# Patient Record
Sex: Female | Born: 1985 | Race: White | Hispanic: No | Marital: Married | Smoking: Current some day smoker
Health system: Southern US, Community
[De-identification: ages and names within clinical notes are randomized; demographics above are authoritative.]

## PROBLEM LIST (undated history)

## (undated) DIAGNOSIS — R51 Headache: Secondary | ICD-10-CM

---
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
## 2006-04-10 ENCOUNTER — Ambulatory Visit (HOSPITAL_COMMUNITY): Admission: AD | Admit: 2006-04-10 | Discharge: 2006-04-10 | Payer: Self-pay | Admitting: Obstetrics and Gynecology

## 2006-04-11 ENCOUNTER — Ambulatory Visit (HOSPITAL_COMMUNITY): Admission: AD | Admit: 2006-04-11 | Discharge: 2006-04-12 | Payer: Self-pay | Admitting: Obstetrics and Gynecology

## 2006-04-24 ENCOUNTER — Ambulatory Visit (HOSPITAL_COMMUNITY): Admission: AD | Admit: 2006-04-24 | Discharge: 2006-04-25 | Payer: Self-pay | Admitting: Obstetrics and Gynecology

## 2006-04-27 ENCOUNTER — Ambulatory Visit (HOSPITAL_COMMUNITY): Admission: AD | Admit: 2006-04-27 | Discharge: 2006-04-27 | Payer: Self-pay | Admitting: Obstetrics and Gynecology

## 2006-05-03 ENCOUNTER — Encounter (INDEPENDENT_AMBULATORY_CARE_PROVIDER_SITE_OTHER): Payer: Self-pay | Admitting: Specialist

## 2006-05-03 ENCOUNTER — Inpatient Hospital Stay (HOSPITAL_COMMUNITY): Admission: RE | Admit: 2006-05-03 | Discharge: 2006-05-06 | Payer: Self-pay | Admitting: Obstetrics & Gynecology

## 2008-06-23 ENCOUNTER — Other Ambulatory Visit: Admission: RE | Admit: 2008-06-23 | Discharge: 2008-06-23 | Payer: Self-pay | Admitting: Obstetrics and Gynecology

## 2009-02-01 ENCOUNTER — Emergency Department (HOSPITAL_COMMUNITY): Admission: EM | Admit: 2009-02-01 | Discharge: 2009-02-01 | Payer: Self-pay | Admitting: Emergency Medicine

## 2009-09-20 ENCOUNTER — Other Ambulatory Visit: Admission: RE | Admit: 2009-09-20 | Discharge: 2009-09-20 | Payer: Self-pay | Admitting: Obstetrics and Gynecology

## 2010-01-04 ENCOUNTER — Emergency Department (HOSPITAL_COMMUNITY): Admission: EM | Admit: 2010-01-04 | Discharge: 2010-01-04 | Payer: Self-pay | Admitting: Emergency Medicine

## 2010-01-05 ENCOUNTER — Ambulatory Visit (HOSPITAL_COMMUNITY): Admission: RE | Admit: 2010-01-05 | Discharge: 2010-01-05 | Payer: Self-pay | Admitting: Emergency Medicine

## 2010-01-06 ENCOUNTER — Emergency Department (HOSPITAL_COMMUNITY): Admission: EM | Admit: 2010-01-06 | Discharge: 2010-01-06 | Payer: Self-pay | Admitting: Emergency Medicine

## 2010-02-06 ENCOUNTER — Encounter (INDEPENDENT_AMBULATORY_CARE_PROVIDER_SITE_OTHER): Payer: Self-pay | Admitting: *Deleted

## 2010-02-22 ENCOUNTER — Encounter (INDEPENDENT_AMBULATORY_CARE_PROVIDER_SITE_OTHER): Payer: Self-pay | Admitting: *Deleted

## 2010-07-28 ENCOUNTER — Emergency Department (HOSPITAL_COMMUNITY): Admission: EM | Admit: 2010-07-28 | Discharge: 2010-07-28 | Payer: Self-pay | Admitting: Emergency Medicine

## 2010-09-23 IMAGING — CT CT ABD-PELV W/ CM
2 of 4 series · 16 of 46 positions shown, 18 images · IV contrast (agent unspecified)
Comparison: None.

CLINICAL DATA: Vaginal and possibly rectal bleeding.

CT ABDOMEN AND PELVIS WITH CONTRAST
TECHNIQUE: Multidetector CT imaging of the abdomen and pelvis was
performed following the standard protocol during bolus
administration of intravenous contrast.
Contrast: 100 ml Jmnipaque-TNN

[Series 2: abd_pel_with 5.0 b40f · axial · 0.65mm/px · z∈[+533,+908]mm · 13 of 83 slices shown, 15 images]
[im 4/83  soft-tissue]
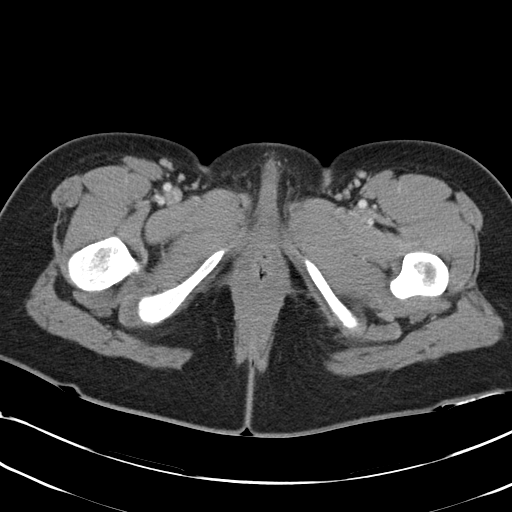
[im 4/83  bone]
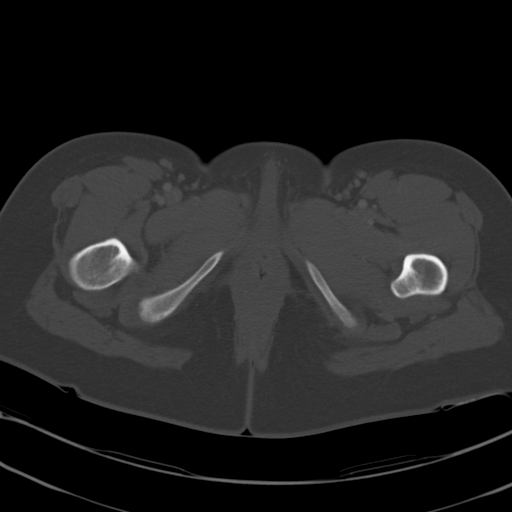
[im 11/83  soft-tissue]
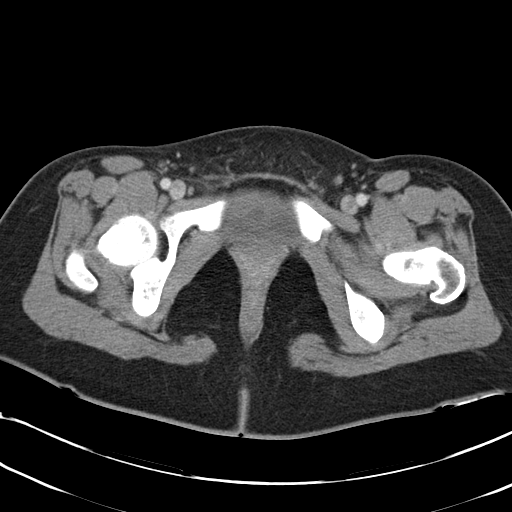
[im 18/83  soft-tissue]
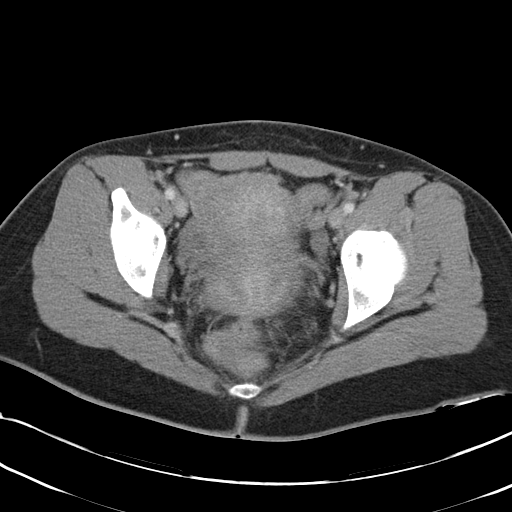
[im 24/83  soft-tissue]
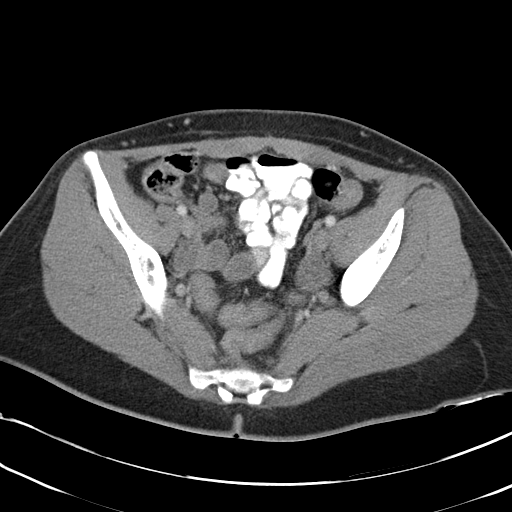
[im 28/83  soft-tissue]
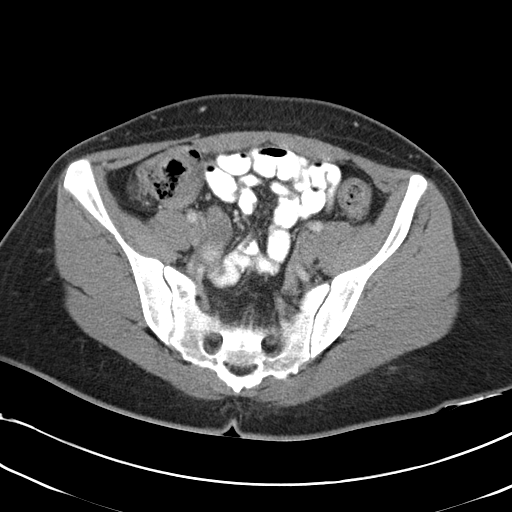
[im 35/83  soft-tissue]
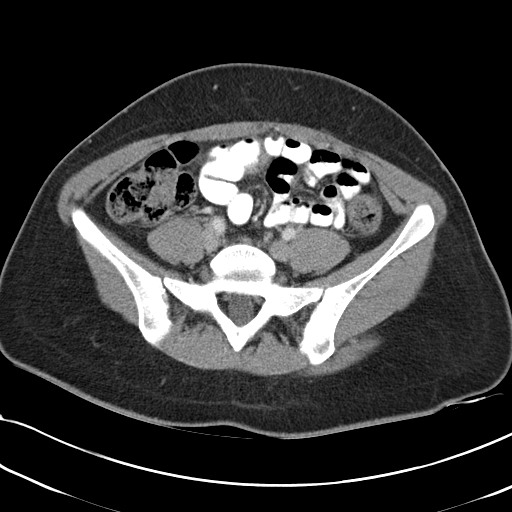
[im 42/83  soft-tissue]
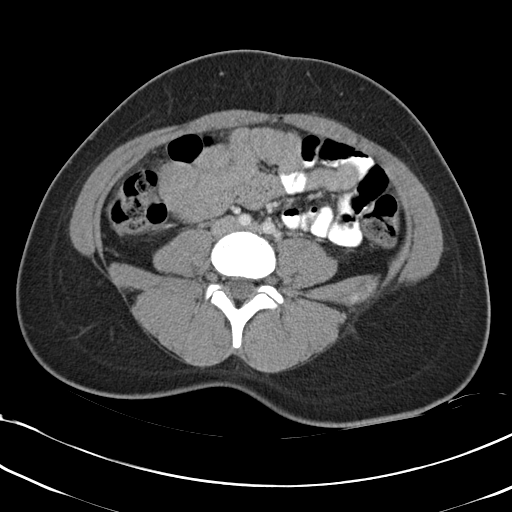
[im 48/83  soft-tissue]
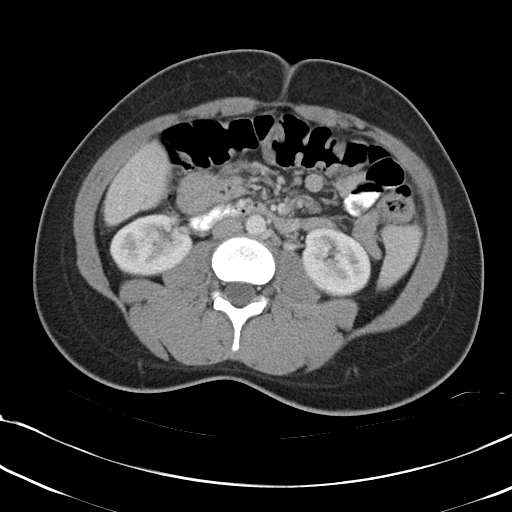
[im 55/83  soft-tissue]
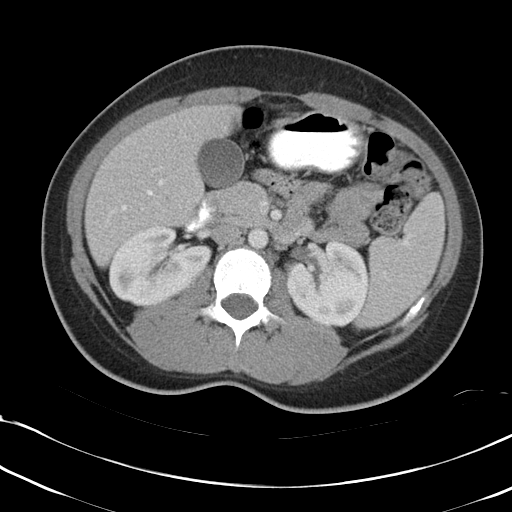
[im 55/83  bone]
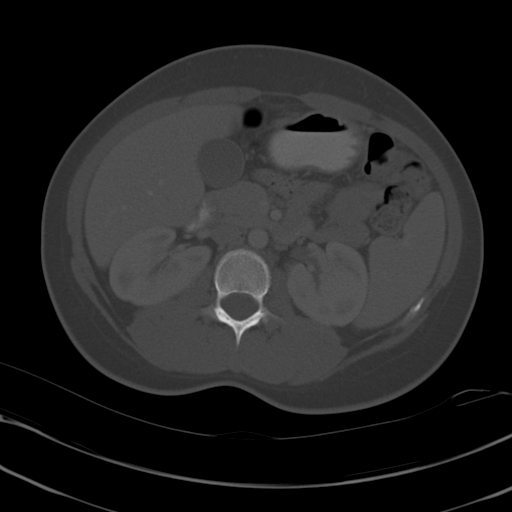
[im 59/83  soft-tissue]
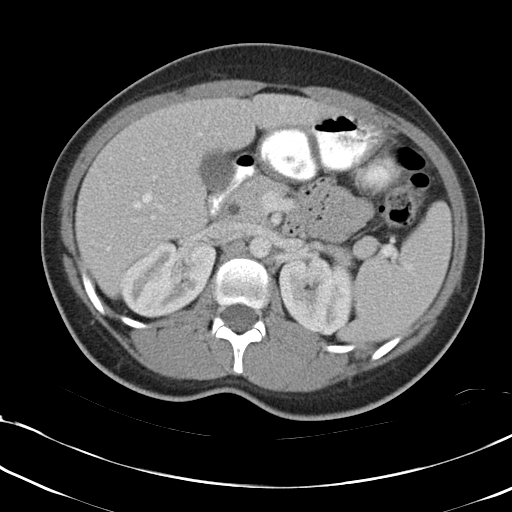
[im 65/83  soft-tissue]
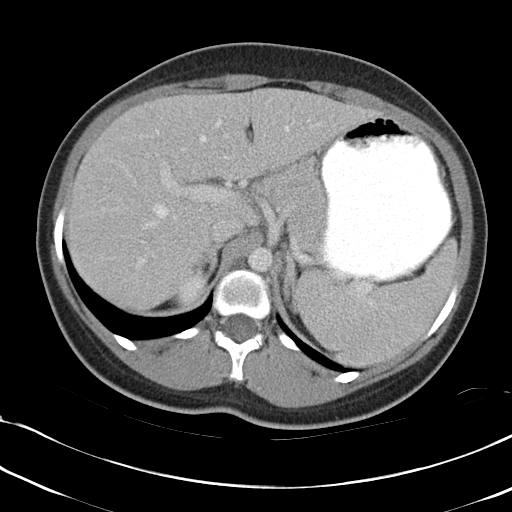
[im 72/83  soft-tissue]
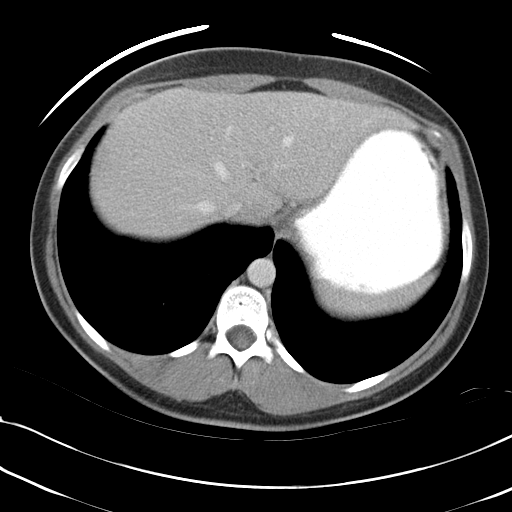
[im 79/83  soft-tissue]
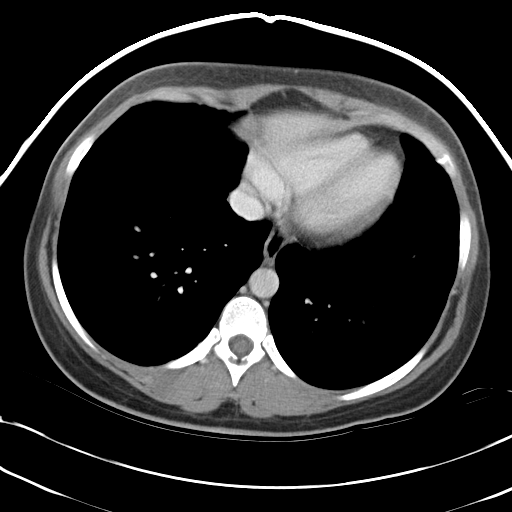

[Series 4: mpr cor post contrast (id) · coronal · 0.63mm/px · 3 of 82 slices shown]
[im 28/82  soft-tissue]
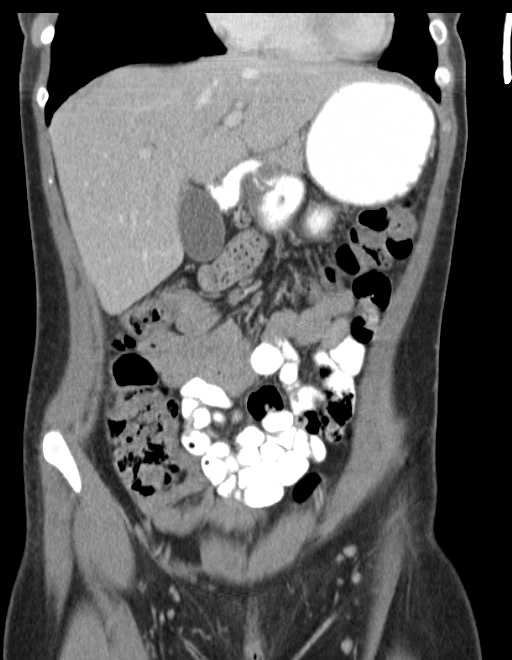
[im 37/82  soft-tissue]
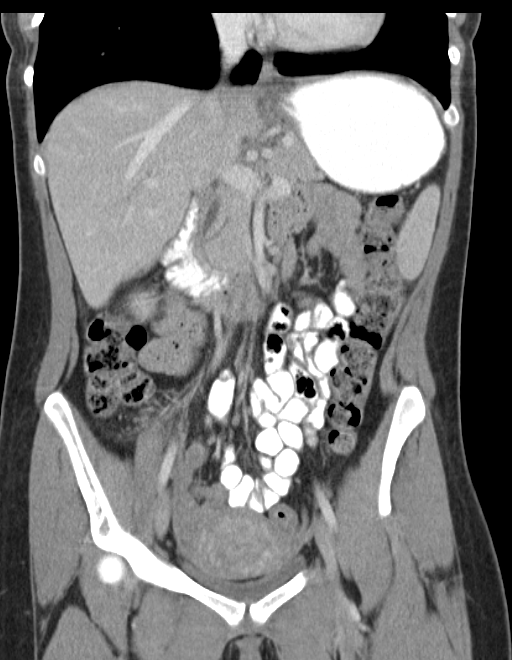
[im 46/82  soft-tissue]
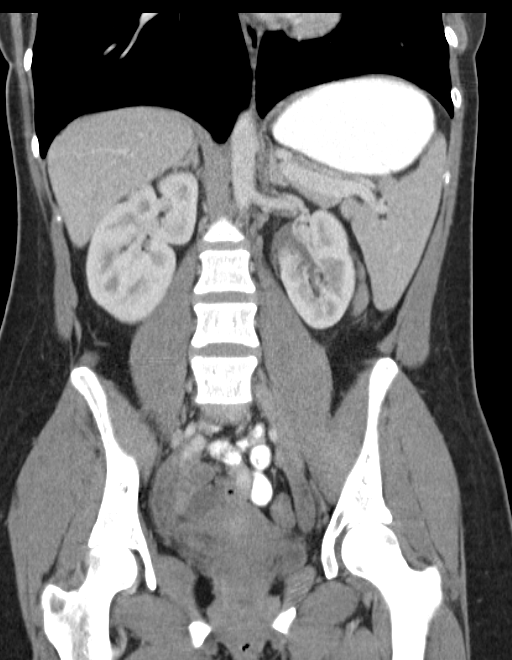

[16 of 46 positions shown; findings below may reference images not displayed]

FINDINGS: The lung bases are clear.  The liver enhances with no
focal abnormality and no ductal dilatation is seen.  No calcified
gallstones are noted.  The common bile duct is noted to be slightly
on the measuring up to 8 mm in diameter.  Correlation with liver
function tests is recommended.  The pancreas is normal in size and
the pancreatic duct is not dilated.  The adrenal glands and spleen
are unremarkable.  The stomach is moderately well distended with
contrast and is unremarkable.  The kidneys enhance with no
calculus, mass, or hydronephrosis.  Abdominal aorta is normal in
caliber.  No adenopathy is seen.

The uterus is somewhat anteverted.  The urinary bladder is
decompressed and but unremarkable.  There are small ovarian
follicles present.  No free fluid is noted within the pelvis.  The
appendix is moderately well seen within the right pelvis and there
is no CT evidence of appendicitis.  The terminal ileum is
unremarkable. No abnormality of the colon is seen.
IMPRESSION: 1. No abdominal or pelvic mass or adenopathy.
2.  The appendix is moderately well seen and is unremarkable.
3.  The common bile duct does measure up to 8 mm in diameter.
Correlate with liver function tests.

## 2010-10-09 ENCOUNTER — Inpatient Hospital Stay (HOSPITAL_COMMUNITY)
Admission: AD | Admit: 2010-10-09 | Discharge: 2010-10-10 | Payer: Self-pay | Source: Home / Self Care | Admitting: Obstetrics & Gynecology

## 2010-11-24 ENCOUNTER — Inpatient Hospital Stay (HOSPITAL_COMMUNITY)
Admission: RE | Admit: 2010-11-24 | Discharge: 2010-11-26 | Payer: Self-pay | Source: Home / Self Care | Attending: Obstetrics and Gynecology | Admitting: Obstetrics and Gynecology

## 2010-12-26 NOTE — Letter (Signed)
Summary: Generic Letter, Intro to Referring  [facility]  [address]   [place], [place] [address]   Phone: [phone]  Fax: [phone]      February 22, 2010             RE: Shelby Torres   Dec 11, 1985                 [address]                 [place], [place]  [address]  Dear Kemper Durie,     We received a referral from your office for the patient listed above. We have tried to reach her by phone and by mail to schedule an appointment for consult. She has never responded. Thank you    Sincerely,    [facility] Ph: [phone]   Fax: [phone]

## 2010-12-26 NOTE — Letter (Signed)
Summary: Appointment Reminder  [name]  [address]   [place], [place] [address]   Phone: [phone]  Fax: [phone]       February 06, 2010   Shelby Torres [address] [place], [place]  [address] 08-Jan-1986    Dear [name],  We have been unable to reach you by phone to schedule a follow up   appointment that was recommended for you by Dr. Jena Gauss . It is very     important that we reach you to schedule an appointment. We hope that you  allow [place] to participate in your health care needs. Please contact us at  [phone] at your earliest convenience to schedule your appointment.  Sincerely,    [facility] R. Roetta Sessions, M.D.    Jonette Eva, M.D. Lorenza Burton, FNP-BC    Tana Coast, PA-C Phone: [phone]    Fax: [phone]

## 2011-02-05 LAB — CBC
HCT: 28.3 % — ABNORMAL LOW (ref 36.0–46.0)
MCH: 31.4 pg (ref 26.0–34.0)
MCHC: 33.2 g/dL (ref 30.0–36.0)
MCV: 96.9 fL (ref 78.0–100.0)
Platelets: 233 10*3/uL (ref 150–400)
RDW: 13.5 % (ref 11.5–15.5)
RDW: 13.7 % (ref 11.5–15.5)
WBC: 5.6 10*3/uL (ref 4.0–10.5)

## 2011-02-05 LAB — SURGICAL PCR SCREEN: MRSA, PCR: NEGATIVE

## 2011-02-05 LAB — RPR: RPR Ser Ql: NONREACTIVE

## 2011-02-06 LAB — COMPREHENSIVE METABOLIC PANEL
Alkaline Phosphatase: 79 U/L (ref 39–117)
CO2: 22 mEq/L (ref 19–32)
Chloride: 106 mEq/L (ref 96–112)
GFR calc non Af Amer: >60 mL/min (ref >60)
Glucose, Bld: 98 mg/dL (ref 70–99)

## 2011-02-06 LAB — URINALYSIS, ROUTINE W REFLEX MICROSCOPIC
Bilirubin Urine: NEGATIVE
Glucose, UA: NEGATIVE mg/dL
Hgb urine dipstick: NEGATIVE
Nitrite: NEGATIVE
Protein, ur: NEGATIVE mg/dL
Specific Gravity, Urine: 1.02 (ref 1.005–1.030)

## 2011-02-06 LAB — CBC
HCT: 29.4 % — ABNORMAL LOW (ref 36.0–46.0)
Hemoglobin: 10.2 g/dL — ABNORMAL LOW (ref 12.0–15.0)
MCH: 34.6 pg — ABNORMAL HIGH (ref 26.0–34.0)
MCHC: 34.8 g/dL (ref 30.0–36.0)
MCV: 99.3 fL (ref 78.0–100.0)
RBC: 2.96 MIL/uL — ABNORMAL LOW (ref 3.87–5.11)

## 2011-02-08 LAB — URINE CULTURE: Culture  Setup Time: 201109032002

## 2011-02-08 LAB — BASIC METABOLIC PANEL
BUN: 7 mg/dL (ref 6–23)
Calcium: 8.7 mg/dL (ref 8.4–10.5)
GFR calc non Af Amer: >60 mL/min (ref >60)

## 2011-02-08 LAB — DIFFERENTIAL
Basophils Absolute: 0 10*3/uL (ref 0.0–0.1)
Lymphocytes Relative: 20 % (ref 12–46)
Lymphs Abs: 1.5 10*3/uL (ref 0.7–4.0)
Monocytes Relative: 5 % (ref 3–12)
Neutro Abs: 5.5 10*3/uL (ref 1.7–7.7)

## 2011-02-08 LAB — CBC
HCT: 32.7 % — ABNORMAL LOW (ref 36.0–46.0)
MCHC: 34.8 g/dL (ref 30.0–36.0)
Platelets: 232 10*3/uL (ref 150–400)
RBC: 3.25 MIL/uL — ABNORMAL LOW (ref 3.87–5.11)
WBC: 7.4 10*3/uL (ref 4.0–10.5)

## 2011-02-08 LAB — URINALYSIS, ROUTINE W REFLEX MICROSCOPIC
Bilirubin Urine: NEGATIVE
Ketones, ur: 15 mg/dL — AB
Protein, ur: NEGATIVE mg/dL
pH: 6 (ref 5.0–8.0)

## 2011-02-14 LAB — URINALYSIS, ROUTINE W REFLEX MICROSCOPIC
Nitrite: NEGATIVE
pH: 5.5 (ref 5.0–8.0)

## 2011-02-14 LAB — COMPREHENSIVE METABOLIC PANEL
ALT: 12 U/L (ref 0–35)
AST: 27 U/L (ref 0–37)
Albumin: 3.9 g/dL (ref 3.5–5.2)
Alkaline Phosphatase: 43 U/L (ref 39–117)
Calcium: 8.9 mg/dL (ref 8.4–10.5)
Creatinine, Ser: 0.86 mg/dL (ref 0.4–1.2)
GFR calc Af Amer: >60 mL/min (ref >60)
GFR calc non Af Amer: >60 mL/min (ref >60)
Potassium: 3.4 mEq/L — ABNORMAL LOW (ref 3.5–5.1)
Sodium: 141 mEq/L (ref 135–145)
Total Protein: 6.8 g/dL (ref 6.0–8.3)

## 2011-02-14 LAB — WET PREP, GENITAL: Yeast Wet Prep HPF POC: NONE SEEN

## 2011-02-14 LAB — DIFFERENTIAL
Basophils Relative: 0 % (ref 0–1)
Lymphocytes Relative: 31 % (ref 12–46)
Monocytes Relative: 7 % (ref 3–12)
Neutrophils Relative %: 61 % (ref 43–77)

## 2011-02-14 LAB — RPR: RPR Ser Ql: NONREACTIVE

## 2011-02-14 LAB — CBC
Hemoglobin: 12.6 g/dL (ref 12.0–15.0)
RDW: 13.1 % (ref 11.5–15.5)
WBC: 4.6 10*3/uL (ref 4.0–10.5)

## 2011-02-14 LAB — PREGNANCY, URINE: Preg Test, Ur: NEGATIVE

## 2011-02-14 LAB — GC/CHLAMYDIA PROBE AMP, GENITAL
Chlamydia, DNA Probe: NEGATIVE
GC Probe Amp, Genital: NEGATIVE

## 2011-04-13 NOTE — Discharge Summary (Signed)
[name], Shelby Torres              ACCOUNT NO.:  [id number]   MEDICAL RECORD NO.:  [id number]          PATIENT TYPE:  INP   LOCATION:  A403                          FACILITY:  [facility]   PHYSICIAN:  Lazaro Arms, M.D.   DATE OF BIRTH:  1986/04/16   DATE OF ADMISSION:  05/03/2006  DATE OF DISCHARGE:  06/11/2007LH                                 DISCHARGE SUMMARY   DISCHARGE DIAGNOSES:  1.  Status post primary cesarean section.  2.  Unremarkable postoperative course.   PROCEDURE:  Primary cesarean section.   HISTORY OF PRESENT ILLNESS:  Please refer to the history and physical as  well as the antepartum chart for details in the hospital.   HOSPITAL COURSE:  The patient was admitted, underwent a primary cesarean  section with an adequate pelvis.  Fetal vertex out of the pelvis and  appropriate for induction.  Her postoperative course was unremarkable.  She  tolerated clear liquids and a regular diet.  She voided without symptoms.  She was extensively ambulatory.  Her preoperative hemoglobin and hematocrit  were 10.6, and 31, was down to 9.6 and 28 on postoperative day #3.  Her  incision was clean, dry, and intact.  She had progression of normal bowel  function.  She was discharged to home on the morning of postoperative day #3  in good, stable condition.  Follow up in the office next week to have her  staples removed.      Lazaro Arms, M.D.  Electronically Signed     LHE/MEDQ  D:  06/13/2006  T:  06/13/2006  Job:  [id number]

## 2011-04-13 NOTE — Op Note (Signed)
Shelby Torres, Shelby Torres              ACCOUNT NO.:  [id number]   MEDICAL RECORD NO.:  [id number]          PATIENT TYPE:  INP   LOCATION:  A403                          FACILITY:  [facility]   PHYSICIAN:  Lazaro Arms, M.D.   DATE OF BIRTH:  05-24-1986   DATE OF PROCEDURE:  05/03/2006  DATE OF DISCHARGE:  05/06/2006                                 OPERATIVE REPORT   PREOPERATIVE DIAGNOSES:  1.  Intrauterine pregnancy at 39-6/[redacted] weeks gestation.  2.  Inadequate pelvis.  3.  Fetal vertex well out of pelvis, inappropriate for induction.   POSTOPERATIVE DIAGNOSES:  1.  Intrauterine pregnancy at 39-6/[redacted] weeks gestation.  2.  Inadequate pelvis.  3.  Fetal vertex well out of pelvis, inappropriate for induction.   PROCEDURE:  Primary cesarean section.   SURGEON:  Lazaro Arms, M.D.   ANESTHESIA:  Spinal.   FINDINGS:  Over a low transverse hysterotomy incision was delivered a viable  female infant with Apgars of 9 and 9, with the weight to be determined in the  nursery.  There was three-vessel cord and cord blood and cord gas were sent.  Placenta was normal, intact.   DESCRIPTION OF OPERATION:  The patient was taken to the operating room and  placed in the sitting position and underwent spinal anesthetic, placed in  the supine position with a roll under her right hip, prepped and draped in  the usual sterile fashion.  A Pfannenstiel skin incision was made and  carried down sharply to the rectus fascia, which was scored in the midline  and extended laterally.  The fascia was taken off the muscle superiorly and  inferiorly without difficulty.  The muscles were divided and the peritoneal  cavity was entered.  A bladder blade was placed.  Vesicouterines traced,  flaps created.  A low transverse hysterotomy incision was made.  Over this  incision was delivered a viable female, Apgars of 7 and 9, and weight was  actually 6 pounds 5 ounces.  Three-vessel cord, cord blood and cord gas were  sent.  Dr.  Milinda Cave was in attendance for routine neonatal resuscitation.  Placenta was normal and intact and sent to pathology for routine evaluation.   The uterus was closed in two layers, the first being a running, interlocking  layer and the second being an imbricating layer.  The uterus was firmed up  nicely.  The uterus was replaced into the peritoneal cavity.  The muscles  and peritoneum were reapproximated loosely.  The pelvis was irrigated  vigorously to insure good hemostasis.  The  fascia was closed using 0 Vicryl running.  Subcutaneous tissue was made  hemostatic and irrigated.  Skin was closed using skin staples.  The patient  tolerated the procedure well.  She experienced 700 mL of blood loss and was  taken to the recovery room in stable condition, all counts correct times  three.      Lazaro Arms, M.D.  Electronically Signed     LHE/MEDQ  D:  06/13/2006  T:  06/13/2006  Job:  [id number]

## 2011-04-13 NOTE — Group Therapy Note (Signed)
NAMERENDI, MAPEL NO.:  [id number]   MEDICAL RECORD NO.:  [id number]          PATIENT TYPE:  OIB   LOCATION:  LDR3                          FACILITY:  [facility]   PHYSICIAN:  Lazaro Arms, M.D.   DATE OF BIRTH:  [birth year]   DATE OF PROCEDURE:  DATE OF DISCHARGE:  04/12/2006                                   PROGRESS NOTE   DICTATED BY: __________   ADMISSION DATE:  Ms. Shelby Torres is [redacted] weeks pregnant, came in with complaints  of vaginal spotting x2.  She is not having any pain.  Fetal heart rate was  reactive.  No contractions were noted.  Cervical exam was fingertip dilated  and firm and thick.  There was no blood noted on the examination glove.  Her  urinalysis was negative.   IMPRESSION:  1.  Intrauterine pregnancy at 36 weeks, vaginal spotting resolved.  2.  Reactive NST.   She was discharged home in stable condition.      Jacklyn Shell, C.N.M.      Lazaro Arms, M.D.  Electronically Signed    FC/MEDQ  D:  04/12/2006  T:  04/12/2006  Job:  [id number]

## 2011-05-26 ENCOUNTER — Emergency Department (HOSPITAL_COMMUNITY)
Admission: EM | Admit: 2011-05-26 | Discharge: 2011-05-26 | Disposition: A | Discharge status: Home / Self Care | Payer: Medicaid Other | Attending: Emergency Medicine | Admitting: Emergency Medicine

## 2011-05-26 DIAGNOSIS — N390 Urinary tract infection, site not specified: Secondary | ICD-10-CM | POA: Insufficient documentation

## 2011-05-26 LAB — URINALYSIS, ROUTINE W REFLEX MICROSCOPIC
Leukocytes, UA: NEGATIVE
Nitrite: NEGATIVE
Specific Gravity, Urine: 1.02 (ref 1.005–1.030)
Urobilinogen, UA: 0.2 mg/dL (ref 0.0–1.0)
pH: 6 (ref 5.0–8.0)

## 2011-05-26 LAB — URINE MICROSCOPIC-ADD ON

## 2011-05-26 LAB — PREGNANCY, URINE: Preg Test, Ur: NEGATIVE

## 2013-09-15 ENCOUNTER — Ambulatory Visit (INDEPENDENT_AMBULATORY_CARE_PROVIDER_SITE_OTHER): Payer: Medicaid Other | Admitting: Obstetrics & Gynecology

## 2013-09-15 ENCOUNTER — Encounter: Payer: Self-pay | Admitting: Obstetrics & Gynecology

## 2013-09-15 VITALS — BP 110/80 | Ht 60.0 in | Wt 146.0 lb

## 2013-09-15 DIAGNOSIS — N719 Inflammatory disease of uterus, unspecified: Secondary | ICD-10-CM | POA: Insufficient documentation

## 2013-09-15 NOTE — Progress Notes (Signed)
Patient ID: Shelby Torres, female   DOB: August 22, 1986, 27 y.o.   MRN: [id number] Patient was seen over the weekend urgent care for vaginal discharge and pain She was evaluated and told she had PID and has been treated with doxycycline and metronidazole She wanted to come here for further evaluation Her discharge is better and her pain is better  On exam she has minimal vaginal discharge it does seem more consistent with cervicitis/vaginitis She has no significant cervical motion tenderness the uterus is nontender the adnexa is nontender  I would be more inclined think this is endometritis insert reading her doxycycline and metronidazole would be appropriate  Once she completes treatment if she has any problems discharge or lingering symptoms she'll let me know  She had her tubes tied 3 years ago 2-3 years ago

## 2013-11-02 ENCOUNTER — Emergency Department (HOSPITAL_COMMUNITY)
Admission: EM | Admit: 2013-11-02 | Discharge: 2013-11-02 | Disposition: A | Discharge status: Home / Self Care | Payer: BC Managed Care – PPO | Attending: Emergency Medicine | Admitting: Emergency Medicine

## 2013-11-02 ENCOUNTER — Encounter (HOSPITAL_COMMUNITY): Payer: Self-pay | Admitting: Emergency Medicine

## 2013-11-02 DIAGNOSIS — Z79899 Other long term (current) drug therapy: Secondary | ICD-10-CM | POA: Insufficient documentation

## 2013-11-02 DIAGNOSIS — K439 Ventral hernia without obstruction or gangrene: Secondary | ICD-10-CM | POA: Diagnosis not present

## 2013-11-02 DIAGNOSIS — R1033 Periumbilical pain: Secondary | ICD-10-CM | POA: Diagnosis present

## 2013-11-02 DIAGNOSIS — Z792 Long term (current) use of antibiotics: Secondary | ICD-10-CM | POA: Insufficient documentation

## 2013-11-02 LAB — CBC WITH DIFFERENTIAL/PLATELET
Band Neutrophils: 0 % (ref 0–10)
Basophils Absolute: 0 10*3/uL (ref 0.0–0.1)
Basophils Relative: 0 % (ref 0–1)
Eosinophils Absolute: 0.1 10*3/uL (ref 0.0–0.7)
HCT: 36 % (ref 36.0–46.0)
MCHC: 35.3 g/dL (ref 30.0–36.0)
Monocytes Relative: 4 % (ref 3–12)
Neutro Abs: 8 10*3/uL — ABNORMAL HIGH (ref 1.7–7.7)
Neutrophils Relative %: 86 % — ABNORMAL HIGH (ref 43–77)
Platelets: 268 10*3/uL (ref 150–400)
RDW: 12 % (ref 11.5–15.5)
WBC: 9.3 10*3/uL (ref 4.0–10.5)

## 2013-11-02 LAB — BASIC METABOLIC PANEL
BUN: 7 mg/dL (ref 6–23)
Chloride: 104 mEq/L (ref 96–112)
Creatinine, Ser: 0.63 mg/dL (ref 0.50–1.10)
GFR calc Af Amer: >90 mL/min (ref >90)
GFR calc non Af Amer: >90 mL/min (ref >90)

## 2013-11-02 MED ORDER — HYDROMORPHONE HCL PF 1 MG/ML IJ SOLN
INTRAMUSCULAR | Status: AC
  Administered 2013-11-02: 1 mg via INTRAVENOUS
  Filled 2013-11-02: qty 1

## 2013-11-02 MED ORDER — DIPHENHYDRAMINE HCL 50 MG/ML IJ SOLN
INTRAMUSCULAR | Status: AC
  Administered 2013-11-02: 25 mg via INTRAVENOUS
  Filled 2013-11-02: qty 1

## 2013-11-02 MED ORDER — ONDANSETRON HCL 4 MG/2ML IJ SOLN
INTRAMUSCULAR | Status: AC
  Administered 2013-11-02: 4 mg via INTRAVENOUS
  Filled 2013-11-02: qty 2

## 2013-11-02 NOTE — ED Notes (Signed)
Instructed in body mechanics to minimize the stress on the abd wall. Pt logrolled off stretcher with minimal help. For discharge.

## 2013-11-02 NOTE — ED Provider Notes (Signed)
CSN: [id number]     Arrival date & time 11/02/13  0026 History   First MD Initiated Contact with Patient 11/02/13 0105     Chief complaint: Abdominal pain  (Consider location/radiation/quality/duration/timing/severity/associated sxs/prior Treatment) The history is provided by the patient.   27 year old female has a history of a hernia above her umbilicus and noted onset about 10:30 PM of some mild pain in that area which suddenly became severe. She rates pain at 10/10. Nothing makes it better nothing makes it worse. She denies radiation of pain and denies nausea vomiting or diarrhea. She has noted a hard knot in that area which she typically has the hernia pops out.  No past medical history on file. Past Surgical History  Procedure Laterality Date  . Cesarean section     No family history on file. History  Substance Use Topics  . Smoking status: Never Smoker   . Smokeless tobacco: Not on file  . Alcohol Use: Not on file   OB History   Grav Para Term Preterm Abortions TAB SAB Ect Mult Living                 Review of Systems  All other systems reviewed and are negative.    Allergies  Review of patient's allergies indicates not on file.  Home Medications   Current Outpatient Rx  Name  Route  Sig  Dispense  Refill  . doxycycline (DORYX) 100 MG EC tablet   Oral   Take 100 mg by mouth 2 (two) times daily.         . metroNIDAZOLE (FLAGYL) 500 MG tablet   Oral   Take 500 mg by mouth 3 (three) times daily.          There were no vitals taken for this visit. Physical Exam  Nursing note and vitals reviewed.  27 year old female, who is in obvious pain, but is in no acute distress. Vital signs are normal. Oxygen saturation is 98%, which is normal. Head is normocephalic and atraumatic. PERRLA, EOMI. Oropharynx is clear. Neck is nontender and supple without adenopathy or JVD. Back is nontender and there is no CVA tenderness. Lungs are clear without rales, wheezes, or  rhonchi. Chest is nontender. Heart has regular rate and rhythm without murmur. Abdomen he has a midline hernia just superior to the umbilicus with firm to hard area about 1 cm in diameter consistent with incarcerated hernia. Remainder of abdomen is soft and nontender. There are no masses or hepatosplenomegaly and peristalsis is normoactive. Extremities have no cyanosis or edema, full range of motion is present. Skin is warm and dry without rash. Neurologic: Mental status is normal, cranial nerves are intact, there are no motor or sensory deficits.  ED Course  Procedures (including critical care time)  MDM   1. Ventral hernia    Incarcerated ventral hernia. Because of its location, this is likely to include only fat from the omentum. It is unlikely that any balance actually stuck in there. Old records are reviewed and I do not see any prior reference to a ventral or umbilical hernia. She will be given a dose of hydromorphone and 1 she has adequate analgesia attempts were made made to reduce the hernia.  She was given a dose of hydromorphone and started having some itching since he is given a dose of diphenhydramine. Following this, a hernia was able to be reduced and she states that she is feeling much better. She is referred to on-call surgery  for followup.  Dione Booze, MD 11/02/13 [phone]

## 2013-11-02 NOTE — ED Notes (Signed)
Hernia reduced by dr Preston Fleeting, pt feels better.

## 2013-11-02 NOTE — ED Notes (Signed)
Was walking in store and had a sudden sharp painful lump come up just above her navel. "I think I have a hernia"

## 2013-11-03 NOTE — H&P (Addendum)
NTS SOAP Note  Vital Signs:  Vitals as of: 11/03/2013: Systolic 137: Diastolic 89: Heart Rate 78: Temp 95.24F: Height 76ft 0in: Weight 148Lbs 0 Ounces: Pain Level 5: BMI 28.9  BMI : 28.9 kg/m2  Subjective: This 27 Years 32 Months old Female presents for of    HERNIA: ,Was seen in ER recently with an incarcerated ventral hernia.  Reduced in ER.  Now comes for evaluation.  States it is made worse with straining.  Has had hernia for some time.  Made worse during pregnancy.  Review of Symptoms:  Constitutional:unremarkable   Head:unremarkable    Eyes:unremarkable   Nose/Mouth/Throat:unremarkable Cardiovascular:  unremarkable   Respiratory:unremarkable   Genitourinary:unremarkable     Musculoskeletal:unremarkable   Skin:unremarkable Hematolgic/Lymphatic:unremarkable     Allergic/Immunologic:unremarkable     Past Medical History:    Reviewed   Past Medical History  Surgical History: c sections Medical Problems: migraines Allergies: nkda Medications: fiorcet   Social History:Reviewed  Social History  Preferred Language: English Race:  White Ethnicity: Not Hispanic / Latino Age: 27 Years 8 Months Marital Status:  S Alcohol: socially Recreational drug(s):  No   Smoking Status: Light tobacoo smoker reviewed on 11/03/2013 Started Date: 11/26/2005 Packs per week: 1.00 Functional Status reviewed on mm/dd/yyyy ------------------------------------------------ Bathing: Normal Cooking: Normal Dressing: Normal Driving: Normal Eating: Normal Managing Meds: Normal Oral Care: Normal Shopping: Normal Toileting: Normal Transferring: Normal Walking: Normal Cognitive Status reviewed on mm/dd/yyyy ------------------------------------------------ Attention: Normal Decision Making: Normal Language: Normal Memory: Normal Motor: Normal Perception: Normal Problem Solving: Normal Visual and Spatial: Normal   Family History:   Reviewed  Family Health History Family History is Unknown    Objective Information: General:  Well appearing, well nourished in no distress. Heart:  RRR, no murmur Lungs:    CTA bilaterally, no wheezes, rhonchi, rales.  Breathing unlabored. Abdomen:Soft, NT/ND, no HSM, no masses.  Small reducible supraumbiilical hernia present.  Assessment:Ventral hernia  Diagnosis &amp; Procedure Smart Code   Plan:Scheduled for ventral herniorrhaphy with mesh on 11/23/13.   Patient Education:Alternative treatments to surgery were discussed with patient (and family).  Risks and benefits  of procedure including bleeding, infection, mesh use, and the possibility of recurrence of the hernia were fully explained to the patient (and family) who gave informed consent. Patient/family questions were addressed.  Follow-up:Pending Surgery   Patient had tubal ligation at second c-section.

## 2013-11-13 NOTE — Patient Instructions (Signed)
Shelby Torres  11/13/2013   Your procedure is scheduled on:  11/23/2013  Report to [facility] at  830  AM.  Call this number if you have problems the morning of surgery: [phone]   Remember:   Do not eat food or drink liquids after midnight.   Take these medicines the morning of surgery with A SIP OF WATER:  none   Do not wear jewelry, make-up or nail polish.  Do not wear lotions, powders, or perfumes.   Do not shave 48 hours prior to surgery. Men may shave face and neck.  Do not bring valuables to the hospital.  [facility] is not responsible for any belongings or valuables.               Contacts, dentures or bridgework may not be worn into surgery.  Leave suitcase in the car. After surgery it may be brought to your room.  For patients admitted to the hospital, discharge time is determined by your treatment team.               Patients discharged the day of surgery will not be allowed to drive home.  Name and phone number of your driver: family  Special Instructions: Shower using CHG 2 nights before surgery and the night before surgery.  If you shower the day of surgery use CHG.  Use special wash - you have one bottle of CHG for all showers.  You should use approximately 1/3 of the bottle for each shower.   Please read over the following fact sheets that you were given: Pain Booklet, Coughing and Deep Breathing, Surgical Site Infection Prevention, Anesthesia Post-op Instructions and Care and Recovery After Surgery Hernia A hernia occurs when an internal organ pushes out through a weak spot in the abdominal wall. Hernias most commonly occur in the groin and around the navel. Hernias often can be pushed back into place (reduced). Most hernias tend to get worse over time. Some abdominal hernias can get stuck in the opening (irreducible or incarcerated hernia) and cannot be reduced. An irreducible abdominal hernia which is tightly squeezed into the opening is at risk for  impaired blood supply (strangulated hernia). A strangulated hernia is a medical emergency. Because of the risk for an irreducible or strangulated hernia, surgery may be recommended to repair a hernia. CAUSES   Heavy lifting.  Prolonged coughing.  Straining to have a bowel movement.  A cut (incision) made during an abdominal surgery. HOME CARE INSTRUCTIONS   Bed rest is not required. You may continue your normal activities.  Avoid lifting more than 10 pounds (4.5 kg) or straining.  Cough gently. If you are a smoker it is best to stop. Even the best hernia repair can break down with the continual strain of coughing. Even if you do not have your hernia repaired, a cough will continue to aggravate the problem.  Do not wear anything tight over your hernia. Do not try to keep it in with an outside bandage or truss. These can damage abdominal contents if they are trapped within the hernia sac.  Eat a normal diet.  Avoid constipation. Straining over long periods of time will increase hernia size and encourage breakdown of repairs. If you cannot do this with diet alone, stool softeners may be used. SEEK IMMEDIATE MEDICAL CARE IF:   You have a fever.  You develop increasing abdominal pain.  You feel nauseous or vomit.  Your hernia is stuck  outside the abdomen, looks discolored, feels hard, or is tender.  You have any changes in your bowel habits or in the hernia that are unusual for you.  You have increased pain or swelling around the hernia.  You cannot push the hernia back in place by applying gentle pressure while lying down. MAKE SURE YOU:   Understand these instructions.  Will watch your condition.  Will get help right away if you are not doing well or get worse. Document Released: 11/12/2005 Document Revised: 02/04/2012 Document Reviewed: 07/01/2008 [facility] Patient Information 2014 [place]. PATIENT INSTRUCTIONS POST-ANESTHESIA  IMMEDIATELY FOLLOWING SURGERY:  Do  not drive or operate machinery for the first twenty four hours after surgery.  Do not make any important decisions for twenty four hours after surgery or while taking narcotic pain medications or sedatives.  If you develop intractable nausea and vomiting or a severe headache please notify your doctor immediately.  FOLLOW-UP:  Please make an appointment with your surgeon as instructed. You do not need to follow up with anesthesia unless specifically instructed to do so.  WOUND CARE INSTRUCTIONS (if applicable):  Keep a dry clean dressing on the anesthesia/puncture wound site if there is drainage.  Once the wound has quit draining you may leave it open to air.  Generally you should leave the bandage intact for twenty four hours unless there is drainage.  If the epidural site drains for more than 36-48 hours please call the anesthesia department.  QUESTIONS?:  Please feel free to call your physician or the hospital operator if you have any questions, and they will be happy to assist you.

## 2013-11-16 ENCOUNTER — Encounter (HOSPITAL_COMMUNITY)
Admission: RE | Admit: 2013-11-16 | Discharge: 2013-11-16 | Disposition: A | Discharge status: Home / Self Care | Payer: BC Managed Care – PPO | Source: Ambulatory Visit | Attending: General Surgery | Admitting: General Surgery

## 2013-11-16 ENCOUNTER — Encounter (HOSPITAL_COMMUNITY): Payer: Self-pay

## 2013-11-16 ENCOUNTER — Encounter (HOSPITAL_COMMUNITY): Payer: Self-pay | Admitting: Pharmacy Technician

## 2013-11-16 DIAGNOSIS — Z01812 Encounter for preprocedural laboratory examination: Secondary | ICD-10-CM | POA: Insufficient documentation

## 2013-11-16 HISTORY — DX: Headache: R51

## 2013-11-16 LAB — BASIC METABOLIC PANEL
CO2: 24 mEq/L (ref 19–32)
GFR calc non Af Amer: >90 mL/min (ref >90)
Glucose, Bld: 100 mg/dL — ABNORMAL HIGH (ref 70–99)
Potassium: 3.7 mEq/L (ref 3.5–5.1)
Sodium: 140 mEq/L (ref 135–145)

## 2013-11-16 LAB — CBC WITH DIFFERENTIAL/PLATELET
Basophils Relative: 0 % (ref 0–1)
Eosinophils Absolute: 0.1 10*3/uL (ref 0.0–0.7)
Hemoglobin: 12.6 g/dL (ref 12.0–15.0)
Lymphocytes Relative: 33 % (ref 12–46)
Lymphs Abs: 1 10*3/uL (ref 0.7–4.0)
MCH: 34.1 pg — ABNORMAL HIGH (ref 26.0–34.0)
MCV: 99.2 fL (ref 78.0–100.0)
Monocytes Relative: 8 % (ref 3–12)
Neutro Abs: 1.8 10*3/uL (ref 1.7–7.7)
Neutrophils Relative %: 57 % (ref 43–77)
RBC: 3.69 MIL/uL — ABNORMAL LOW (ref 3.87–5.11)
RDW: 12 % (ref 11.5–15.5)
WBC: 3.2 10*3/uL — ABNORMAL LOW (ref 4.0–10.5)

## 2013-11-16 LAB — HCG, SERUM, QUALITATIVE: Preg, Serum: NEGATIVE

## 2013-11-23 ENCOUNTER — Encounter (HOSPITAL_COMMUNITY): Payer: BC Managed Care – PPO | Admitting: Certified Registered"

## 2013-11-23 ENCOUNTER — Ambulatory Visit (HOSPITAL_COMMUNITY)
Admission: RE | Admit: 2013-11-23 | Discharge: 2013-11-23 | Disposition: A | Discharge status: Home / Self Care | Payer: BC Managed Care – PPO | Source: Ambulatory Visit | Attending: General Surgery | Admitting: General Surgery

## 2013-11-23 ENCOUNTER — Encounter (HOSPITAL_COMMUNITY)
Admission: RE | Disposition: A | Discharge status: Home / Self Care | Payer: Self-pay | Source: Ambulatory Visit | Attending: General Surgery

## 2013-11-23 ENCOUNTER — Ambulatory Visit (HOSPITAL_COMMUNITY): Payer: BC Managed Care – PPO | Admitting: Certified Registered"

## 2013-11-23 ENCOUNTER — Encounter (HOSPITAL_COMMUNITY): Payer: Self-pay | Admitting: *Deleted

## 2013-11-23 DIAGNOSIS — K439 Ventral hernia without obstruction or gangrene: Secondary | ICD-10-CM | POA: Diagnosis present

## 2013-11-23 HISTORY — PX: INSERTION OF MESH: SHX5868

## 2013-11-23 HISTORY — PX: VENTRAL HERNIA REPAIR: SHX424

## 2013-11-23 SURGERY — REPAIR, HERNIA, VENTRAL
Anesthesia: General | Site: Abdomen

## 2013-11-23 MED ORDER — FENTANYL CITRATE 0.05 MG/ML IJ SOLN
INTRAMUSCULAR | Status: AC
  Filled 2013-11-23: qty 2

## 2013-11-23 MED ORDER — PROPOFOL 10 MG/ML IV BOLUS
INTRAVENOUS | Status: AC
  Filled 2013-11-23: qty 20

## 2013-11-23 MED ORDER — KETOROLAC TROMETHAMINE 30 MG/ML IJ SOLN
INTRAMUSCULAR | Status: AC
  Filled 2013-11-23: qty 1

## 2013-11-23 MED ORDER — LIDOCAINE HCL (CARDIAC) 10 MG/ML IV SOLN
INTRAVENOUS | Status: DC | PRN
  Administered 2013-11-23 (×2): 20 mg via INTRAVENOUS

## 2013-11-23 MED ORDER — KETOROLAC TROMETHAMINE 30 MG/ML IJ SOLN
30.0000 mg | Freq: Once | INTRAMUSCULAR | Status: AC
  Administered 2013-11-23: 30 mg via INTRAVENOUS

## 2013-11-23 MED ORDER — LIDOCAINE HCL (PF) 1 % IJ SOLN
INTRAMUSCULAR | Status: AC
  Filled 2013-11-23: qty 5

## 2013-11-23 MED ORDER — MIDAZOLAM HCL 2 MG/2ML IJ SOLN
INTRAMUSCULAR | Status: AC
  Filled 2013-11-23: qty 2

## 2013-11-23 MED ORDER — 0.9 % SODIUM CHLORIDE (POUR BTL) OPTIME
TOPICAL | Status: DC | PRN
  Administered 2013-11-23: 1000 mL

## 2013-11-23 MED ORDER — LACTATED RINGERS IV SOLN
INTRAVENOUS | Status: DC
  Administered 2013-11-23: 1000 mL via INTRAVENOUS
  Administered 2013-11-23: 12:00:00 via INTRAVENOUS

## 2013-11-23 MED ORDER — ROCURONIUM BROMIDE 50 MG/5ML IV SOLN
INTRAVENOUS | Status: AC
  Filled 2013-11-23: qty 1

## 2013-11-23 MED ORDER — OXYCODONE-ACETAMINOPHEN 7.5-325 MG PO TABS: 1.0000 | ORAL_TABLET | ORAL | Status: DC | PRN

## 2013-11-23 MED ORDER — CHLORHEXIDINE GLUCONATE 4 % EX LIQD: 1.0000 "application " | Freq: Once | CUTANEOUS | Status: DC

## 2013-11-23 MED ORDER — ROCURONIUM BROMIDE 100 MG/10ML IV SOLN
INTRAVENOUS | Status: DC | PRN
  Administered 2013-11-23: 5 mg via INTRAVENOUS

## 2013-11-23 MED ORDER — PROPOFOL 10 MG/ML IV BOLUS
INTRAVENOUS | Status: DC | PRN
  Administered 2013-11-23: 180 mg via INTRAVENOUS

## 2013-11-23 MED ORDER — FENTANYL CITRATE 0.05 MG/ML IJ SOLN
25.0000 ug | INTRAMUSCULAR | Status: DC | PRN
  Administered 2013-11-23 (×2): 50 ug via INTRAVENOUS

## 2013-11-23 MED ORDER — FENTANYL CITRATE 0.05 MG/ML IJ SOLN
INTRAMUSCULAR | Status: AC
  Filled 2013-11-23: qty 5

## 2013-11-23 MED ORDER — BUPIVACAINE HCL (PF) 0.5 % IJ SOLN
INTRAMUSCULAR | Status: AC
  Filled 2013-11-23: qty 30

## 2013-11-23 MED ORDER — BUPIVACAINE HCL (PF) 0.5 % IJ SOLN
INTRAMUSCULAR | Status: DC | PRN
  Administered 2013-11-23: 10 mL

## 2013-11-23 MED ORDER — ONDANSETRON HCL 4 MG/2ML IJ SOLN
4.0000 mg | Freq: Once | INTRAMUSCULAR | Status: AC
  Administered 2013-11-23: 4 mg via INTRAVENOUS
  Filled 2013-11-23: qty 2

## 2013-11-23 MED ORDER — NEOSTIGMINE METHYLSULFATE 1 MG/ML IJ SOLN
INTRAMUSCULAR | Status: DC | PRN
  Administered 2013-11-23: 1 mg via INTRAVENOUS
  Administered 2013-11-23: 2 mg via INTRAVENOUS

## 2013-11-23 MED ORDER — CEFAZOLIN SODIUM-DEXTROSE 2-3 GM-% IV SOLR
INTRAVENOUS | Status: AC
  Filled 2013-11-23: qty 50

## 2013-11-23 MED ORDER — FENTANYL CITRATE 0.05 MG/ML IJ SOLN
INTRAMUSCULAR | Status: DC | PRN
  Administered 2013-11-23: 100 ug via INTRAVENOUS
  Administered 2013-11-23 (×2): 25 ug via INTRAVENOUS
  Administered 2013-11-23: 100 ug via INTRAVENOUS

## 2013-11-23 MED ORDER — POVIDONE-IODINE 10 % EX OINT
TOPICAL_OINTMENT | CUTANEOUS | Status: AC
  Filled 2013-11-23: qty 1

## 2013-11-23 MED ORDER — ONDANSETRON HCL 4 MG/2ML IJ SOLN: 4.0000 mg | Freq: Once | INTRAMUSCULAR | Status: DC | PRN

## 2013-11-23 MED ORDER — CEFAZOLIN SODIUM-DEXTROSE 2-3 GM-% IV SOLR
2.0000 g | INTRAVENOUS | Status: AC
  Administered 2013-11-23: 2 g via INTRAVENOUS
  Filled 2013-11-23: qty 50

## 2013-11-23 MED ORDER — MIDAZOLAM HCL 2 MG/2ML IJ SOLN
1.0000 mg | INTRAMUSCULAR | Status: DC | PRN
  Administered 2013-11-23 (×2): 2 mg via INTRAVENOUS
  Filled 2013-11-23: qty 2

## 2013-11-23 MED ORDER — GLYCOPYRROLATE 0.2 MG/ML IJ SOLN
INTRAMUSCULAR | Status: DC | PRN
  Administered 2013-11-23: 0.4 mg via INTRAVENOUS
  Administered 2013-11-23: 0.2 mg via INTRAVENOUS

## 2013-11-23 MED ORDER — POVIDONE-IODINE 10 % OINT PACKET
TOPICAL_OINTMENT | CUTANEOUS | Status: DC | PRN
  Administered 2013-11-23: 1 via TOPICAL

## 2013-11-23 SURGICAL SUPPLY — 44 items
BAG HAMPER (MISCELLANEOUS) ×2 IMPLANT
BLADE SURG SZ11 CARB STEEL (BLADE) IMPLANT
CLOTH BEACON ORANGE TIMEOUT ST (SAFETY) ×2 IMPLANT
COVER LIGHT HANDLE STERIS (MISCELLANEOUS) ×4 IMPLANT
DECANTER SPIKE VIAL GLASS SM (MISCELLANEOUS) IMPLANT
DURAPREP 26ML APPLICATOR (WOUND CARE) ×2 IMPLANT
ELECT REM PT RETURN 9FT ADLT (ELECTROSURGICAL) ×2
ELECTRODE REM PT RTRN 9FT ADLT (ELECTROSURGICAL) ×1 IMPLANT
FORMALIN 10 PREFIL 480ML (MISCELLANEOUS) ×1 IMPLANT
GLOVE BIOGEL PI IND STRL 7.0 (GLOVE) IMPLANT
GLOVE BIOGEL PI IND STRL 8 (GLOVE) ×1 IMPLANT
GLOVE BIOGEL PI INDICATOR 7.0 (GLOVE) ×2
GLOVE BIOGEL PI INDICATOR 8 (GLOVE) ×1
GLOVE ECLIPSE 6.5 STRL STRAW (GLOVE) ×1 IMPLANT
GLOVE ECLIPSE 7.0 STRL STRAW (GLOVE) ×1 IMPLANT
GLOVE ECLIPSE 7.5 STRL STRAW (GLOVE) ×2 IMPLANT
GLOVE EXAM NITRILE MD LF STRL (GLOVE) ×1 IMPLANT
GOWN STRL REIN XL XLG (GOWN DISPOSABLE) ×6 IMPLANT
INST SET MAJOR GENERAL (KITS) ×2 IMPLANT
KIT ROOM TURNOVER APOR (KITS) ×2 IMPLANT
MANIFOLD NEPTUNE II (INSTRUMENTS) ×2 IMPLANT
MESH VENTRALEX ST 1-7/10 CRC S (Mesh General) ×1 IMPLANT
NDL HYPO 25X1 1.5 SAFETY (NEEDLE) ×1 IMPLANT
NEEDLE HYPO 25X1 1.5 SAFETY (NEEDLE) ×2 IMPLANT
NS IRRIG 1000ML POUR BTL (IV SOLUTION) ×2 IMPLANT
PACK ABDOMINAL MAJOR (CUSTOM PROCEDURE TRAY) ×2 IMPLANT
PAD ARMBOARD 7.5X6 YLW CONV (MISCELLANEOUS) ×2 IMPLANT
SET BASIN LINEN APH (SET/KITS/TRAYS/PACK) ×2 IMPLANT
SPONGE GAUZE 2X2 8PLY STRL LF (GAUZE/BANDAGES/DRESSINGS) ×1 IMPLANT
SPONGE GAUZE 4X4 12PLY (GAUZE/BANDAGES/DRESSINGS) ×2 IMPLANT
STAPLER VISISTAT (STAPLE) ×2 IMPLANT
SUT ETHIBOND NAB MO 7 #0 18IN (SUTURE) ×1 IMPLANT
SUT NOVA NAB GS-21 1 T12 (SUTURE) IMPLANT
SUT NOVA NAB GS-22 2 2-0 T-19 (SUTURE) ×2 IMPLANT
SUT NOVA NAB GS-26 0 60 (SUTURE) IMPLANT
SUT SILK 2 0 (SUTURE)
SUT SILK 2-0 18XBRD TIE 12 (SUTURE) IMPLANT
SUT VIC AB 2-0 CT2 27 (SUTURE) ×1 IMPLANT
SUT VIC AB 3-0 SH 27 (SUTURE)
SUT VIC AB 3-0 SH 27X BRD (SUTURE) IMPLANT
SUT VIC AB 4-0 PS2 27 (SUTURE) IMPLANT
SUT VICRYL AB 2 0 TIES (SUTURE) ×2 IMPLANT
SYR CONTROL 10ML LL (SYRINGE) ×2 IMPLANT
TAPE CLOTH SURG 4X10 WHT LF (GAUZE/BANDAGES/DRESSINGS) ×1 IMPLANT

## 2013-11-23 NOTE — Interval H&P Note (Signed)
History and Physical Interval Note:  11/23/2013 10:41 AM  Shelby Torres  has presented today for surgery, with the diagnosis of ventral hernia  The various methods of treatment have been discussed with the patient and family. After consideration of risks, benefits and other options for treatment, the patient has consented to  Procedure(s): HERNIA REPAIR VENTRAL ADULT (N/A) as a surgical intervention .  The patient's history has been reviewed, patient examined, no change in status, stable for surgery.  I have reviewed the patient's chart and labs.  Questions were answered to the patient's satisfaction.     Franky Macho A

## 2013-11-23 NOTE — Op Note (Signed)
Patient:  Shelby Torres  DOB:  [birth year]  MRN:  [id number]   Preop Diagnosis:  Ventral hernia  Postop Diagnosis:  Same  Procedure:  Ventral herniorrhaphy with mesh  Surgeon:  Franky Macho, M.D.  Anes:  General endotracheal  Indications:  Patient is a 27 year old white female who presents with a ventral hernia. It is just superior to the umbilicus. The risks and benefits of the procedure including bleeding, infection, mesh use, and a possibly recurrence of the hernia were fully explained to the patient, who gave informed consent.  Procedure note:  The patient is placed the supine position. After general anesthesia was administered, the abdomen was prepped and draped using usual sterile technique with DuraPrep. Surgical site confirmation was performed.  A supraumbilical incision was made down the fascia. The patient was noted to have some falciform ligament and omentum in a small hernia defect along the midline, just superior to the umbilicus. The excess adipose tissue was excised and ligated using a 3-0 Vicryl tie. The hernia sac was excised and any remaining contents was reduced. The defect measured approximately 1 cm in its greatest diameter. A ventral X. STD hernia patch, 4.3 cm, was then inserted and the tabs were used to secure it to the fascia using 0 Ethibond interrupted sutures. The fascia was reapproximated over the herniated disc using 0 Ethibond interrupted sutures in a transverse manner. The subcutaneous layer was reapproximated using 3-0 Vicryl interrupted suture. The skin was closed using staples. 0.5% Sensorcaine was instilled the surrounding wound. Betadine ointment and dressed a dressings were applied.  All tape and needle counts were correct at the end of the procedure. Patient was awakened in the operating room and transferred to PACU in stable condition.  Complications:  None  EBL:  Minimal  Specimen:  None

## 2013-11-23 NOTE — Anesthesia Procedure Notes (Signed)
Procedure Name: Intubation Date/Time: 11/23/2013 10:59 AM Performed by: Franco Nones Pre-anesthesia Checklist: Patient identified, Patient being monitored, Timeout performed, Emergency Drugs available and Suction available Patient Re-evaluated:Patient Re-evaluated prior to inductionOxygen Delivery Method: Circle System Utilized Preoxygenation: Pre-oxygenation with 100% oxygen Intubation Type: IV induction Ventilation: Mask ventilation without difficulty Laryngoscope Size: Miller and 2 Grade View: Grade I Tube type: Oral Tube size: 7.0 mm Number of attempts: 1 Airway Equipment and Method: stylet Placement Confirmation: ETT inserted through vocal cords under direct vision,  positive ETCO2 and breath sounds checked- equal and bilateral Secured at: 21 cm Tube secured with: Tape Dental Injury: Teeth and Oropharynx as per pre-operative assessment

## 2013-11-23 NOTE — Anesthesia Postprocedure Evaluation (Signed)
Anesthesia Post Note  Patient: Shelby Torres  Procedure(s) Performed: Procedure(s) (LRB): VENTRAL HERNIORRAPHY (N/A) INSERTION OF MESH (N/A)  Anesthesia type: General  Patient location: PACU  Post pain: Pain level controlled  Post assessment: Post-op Vital signs reviewed, Patient's Cardiovascular Status Stable, Respiratory Function Stable, Patent Airway, No signs of Nausea or vomiting and Pain level controlled  Last Vitals:  Filed Vitals:   11/23/13 1147  BP: 126/78  Pulse: 82  Temp: 36.7 C  Resp: 10    Post vital signs: Reviewed and stable  Level of consciousness: awake and alert   Complications: No apparent anesthesia complications

## 2013-11-23 NOTE — Transfer of Care (Signed)
Immediate Anesthesia Transfer of Care Note  Patient: Shelby Torres  Procedure(s) Performed: Procedure(s) (LRB): VENTRAL HERNIORRAPHY (N/A) INSERTION OF MESH (N/A)  Patient Location: PACU  Anesthesia Type: General  Level of Consciousness: awake  Airway & Oxygen Therapy: Patient Spontanous Breathing and non-rebreather face mask  Post-op Assessment: Report given to PACU RN, Post -op Vital signs reviewed and stable and Patient moving all extremities  Post vital signs: Reviewed and stable  Complications: No apparent anesthesia complications

## 2013-11-23 NOTE — Anesthesia Preprocedure Evaluation (Signed)
Anesthesia Evaluation  Patient identified by MRN, date of birth, ID band Patient awake    Reviewed: Allergy & Precautions, H&P , NPO status , Patient's Chart, lab work & pertinent test results  Airway Mallampati: II TM Distance: >3 FB Neck ROM: Full    Dental  (+) Teeth Intact   Pulmonary Current Smoker,  breath sounds clear to auscultation        Cardiovascular negative cardio ROS  Rhythm:Regular Rate:Normal     Neuro/Psych  Headaches,    GI/Hepatic negative GI ROS,   Endo/Other    Renal/GU      Musculoskeletal   Abdominal   Peds  Hematology   Anesthesia Other Findings   Reproductive/Obstetrics                           Anesthesia Physical Anesthesia Plan  ASA: I  Anesthesia Plan: General   Post-op Pain Management:    Induction: Intravenous  Airway Management Planned: Oral ETT  Additional Equipment:   Intra-op Plan:   Post-operative Plan: Extubation in OR  Informed Consent: I have reviewed the patients History and Physical, chart, labs and discussed the procedure including the risks, benefits and alternatives for the proposed anesthesia with the patient or authorized representative who has indicated his/her understanding and acceptance.     Plan Discussed with:   Anesthesia Plan Comments:         Anesthesia Quick Evaluation

## 2013-11-24 ENCOUNTER — Encounter (HOSPITAL_COMMUNITY): Payer: Self-pay | Admitting: General Surgery

## 2015-01-26 ENCOUNTER — Encounter: Payer: Self-pay | Admitting: Obstetrics and Gynecology

## 2015-01-26 ENCOUNTER — Ambulatory Visit (INDEPENDENT_AMBULATORY_CARE_PROVIDER_SITE_OTHER): Payer: BC Managed Care – PPO | Admitting: Obstetrics and Gynecology

## 2015-01-26 VITALS — BP 120/82 | Ht 60.0 in | Wt 141.0 lb

## 2015-01-26 DIAGNOSIS — Z658 Other specified problems related to psychosocial circumstances: Secondary | ICD-10-CM | POA: Diagnosis not present

## 2015-01-26 DIAGNOSIS — N939 Abnormal uterine and vaginal bleeding, unspecified: Secondary | ICD-10-CM | POA: Insufficient documentation

## 2015-01-26 DIAGNOSIS — F439 Reaction to severe stress, unspecified: Secondary | ICD-10-CM | POA: Insufficient documentation

## 2015-01-26 MED ORDER — MEGESTROL ACETATE 40 MG PO TABS: 40.0000 mg | ORAL_TABLET | Freq: Three times a day (TID) | ORAL | Status: AC

## 2015-01-26 NOTE — Progress Notes (Signed)
Patient ID: Marquette Saashlyn C Heart, female   DOB: 1986-10-11, 29 y.o.   MRN: [id number] Pt here today for irregular bleeding. Pt states that she started a period the first week of February and bled for 3 weeks, stopped for a few days and is bleeding again. Pt states that she has really bad cramping as well.  No problem since teen years. Now s/p tubal ligation. LNMP January 20th, then bled early FEb, then noted postcoital bleeding and began to cramp and then developed heavy menses  Since 2/28.  ligher today. Less cramping today.    [facility] Visit  Patient name: Marquette Saashlyn C Hilley MRN [id number]  Date of birth: 1986-10-11  CC & HPI:  Marquette Saashlyn C [place] is a 29 y.o. female presenting today for irreg bleeding.  ROS:  Works with class of 7 adolescent juvenile males, high stress. Concerned over Husband's lack of connection to children. "he said he "wished he didn't have them with me". Pt hurt by comments. Pt has Just walked out to leave husb to deal with children.  Pertinent History Reviewed:   Reviewed: Significant for regular mensess Medical         Past Medical History  Diagnosis Date  . UVOZDGUY(403.4Headache(784.0)                               Surgical Hx:    Past Surgical History  Procedure Laterality Date  . Cesarean section  2007    [facility]  . Ventral hernia repair N/A 11/23/2013    Procedure: VENTRAL HERNIORRAPHY;  Surgeon: Dalia HeadingMark A Jenkins, MD;  Location: [facility];  Service: General;  Laterality: N/A;  . Insertion of mesh N/A 11/23/2013    Procedure: INSERTION OF MESH;  Surgeon: Dalia HeadingMark A Jenkins, MD;  Location: [facility];  Service: General;  Laterality: N/A;   Medications: Reviewed & Updated - see associated section                       Current outpatient prescriptions:  .  butalbital-acetaminophen-caffeine (FIORICET, ESGIC) 50-325-40 MG per tablet, Take 1 tablet by mouth daily as needed for migraine. , Disp: , Rfl:    Social History: Reviewed -  reports that she has been smoking Cigarettes.  She  has a .25 pack-year smoking history. She has never used smokeless tobacco.  Objective Findings:  Vitals: Blood pressure 120/82, height 5' (1.524 m), weight 141 lb (63.957 kg), last menstrual period 12/28/2014.  Physical Examination: General appearance - alert, well appearing, and in no distress, oriented to person, place, and time, normal appearing weight and anxious Mental status - alert, oriented to person, place, and time, normal mood, behavior, speech, dress, motor activity, and thought processes, anxious Abdomen - soft, nontender, nondistended, no masses or organomegaly Pelvic - normal external genitalia, vulva, vagina, cervix, uterus and adnexa, nonpurulent, lite blood invagina. nontender bimanual. Small uterus   Assessment & Plan:   A:  1. AUB 2. Domestic stressors , husband, grandmom with [place]  P:  1. 1Megace 40 tid til bleeding stops. 2.  Discussed coping strategies with pt, suggest pastor or counsellor.      10:07 AM

## 2015-02-28 ENCOUNTER — Other Ambulatory Visit: Payer: BC Managed Care – PPO | Admitting: Obstetrics and Gynecology

## 2015-02-28 ENCOUNTER — Encounter: Payer: Self-pay | Admitting: Obstetrics and Gynecology

## 2015-03-23 ENCOUNTER — Other Ambulatory Visit: Payer: Self-pay | Admitting: Obstetrics and Gynecology

## 2015-03-23 NOTE — Telephone Encounter (Signed)
Needs to be seen

## 2015-03-24 ENCOUNTER — Telehealth: Payer: Self-pay | Admitting: *Deleted

## 2015-03-24 NOTE — Telephone Encounter (Signed)
Called pt to inform she would need an appt with Dr. Emelda FearFerguson to get the Megace refilled. Pt states did not need refill for the Megace, "don't know why pharmacy sent refill request."

## 2016-09-26 ENCOUNTER — Encounter (HOSPITAL_COMMUNITY): Payer: Self-pay

## 2016-09-26 ENCOUNTER — Emergency Department (HOSPITAL_COMMUNITY)
Admission: EM | Admit: 2016-09-26 | Discharge: 2016-09-26 | Disposition: A | Discharge status: Home / Self Care | Payer: BC Managed Care – PPO | Attending: Emergency Medicine | Admitting: Emergency Medicine

## 2016-09-26 DIAGNOSIS — A5901 Trichomonal vulvovaginitis: Secondary | ICD-10-CM | POA: Insufficient documentation

## 2016-09-26 DIAGNOSIS — N938 Other specified abnormal uterine and vaginal bleeding: Secondary | ICD-10-CM | POA: Diagnosis not present

## 2016-09-26 DIAGNOSIS — Z79899 Other long term (current) drug therapy: Secondary | ICD-10-CM | POA: Insufficient documentation

## 2016-09-26 DIAGNOSIS — F17210 Nicotine dependence, cigarettes, uncomplicated: Secondary | ICD-10-CM | POA: Diagnosis not present

## 2016-09-26 DIAGNOSIS — R109 Unspecified abdominal pain: Secondary | ICD-10-CM

## 2016-09-26 DIAGNOSIS — A599 Trichomoniasis, unspecified: Secondary | ICD-10-CM

## 2016-09-26 DIAGNOSIS — N939 Abnormal uterine and vaginal bleeding, unspecified: Secondary | ICD-10-CM | POA: Diagnosis present

## 2016-09-26 LAB — URINALYSIS, ROUTINE W REFLEX MICROSCOPIC
Bilirubin Urine: NEGATIVE
Glucose, UA: NEGATIVE mg/dL
Ketones, ur: NEGATIVE mg/dL
LEUKOCYTES UA: NEGATIVE
NITRITE: NEGATIVE
Protein, ur: NEGATIVE mg/dL
SPECIFIC GRAVITY, URINE: 1.015 (ref 1.005–1.030)
pH: 7 (ref 5.0–8.0)

## 2016-09-26 LAB — PREGNANCY, URINE: Preg Test, Ur: NEGATIVE

## 2016-09-26 LAB — URINE MICROSCOPIC-ADD ON

## 2016-09-26 LAB — WET PREP, GENITAL
Clue Cells Wet Prep HPF POC: NONE SEEN
Sperm: NONE SEEN
Yeast Wet Prep HPF POC: NONE SEEN

## 2016-09-26 MED ORDER — KETOROLAC TROMETHAMINE 10 MG PO TABS: 10.0000 mg | ORAL_TABLET | Freq: Four times a day (QID) | ORAL | 0 refills | Status: AC | PRN

## 2016-09-26 MED ORDER — METRONIDAZOLE 500 MG PO TABS
2000.0000 mg | ORAL_TABLET | Freq: Once | ORAL | Status: AC
  Administered 2016-09-26: 2000 mg via ORAL
  Filled 2016-09-26: qty 4

## 2016-09-26 MED ORDER — KETOROLAC TROMETHAMINE 30 MG/ML IJ SOLN
30.0000 mg | Freq: Once | INTRAMUSCULAR | Status: AC
  Administered 2016-09-26: 30 mg via INTRAMUSCULAR
  Filled 2016-09-26: qty 1

## 2016-09-26 NOTE — ED Provider Notes (Signed)
[facility] Provider Note   CSN: [id number] Arrival date & time: 09/26/16  1918  By signing my name below, I, Linna DarnerRussell Turner, attest that this documentation has been prepared under the direction and in the presence of physician practitioner, Jacalyn LefevreJulie Kimoni Pickerill, MD. Electronically Signed: Linna Darnerussell Turner, Scribe. 09/26/2016. 9:37 PM.  History   Chief Complaint Chief Complaint  Patient presents with  . Abdominal Pain  . Vaginal Bleeding    The history is provided by the patient. No language interpreter was used.     HPI Comments: Shelby Torres is a 30 y.o. female who presents to the Emergency Department complaining of sudden onset, constant, vaginal bleeding for the last 3 days. She states her vaginal bleeding began as spotting but has gotten heavier. She states her LMP ended 2 weeks ago. She notes associated dysuria as well as LLQ abdominal pain that worsened 2 days ago. She also reports some nausea currently. Pt notes a h/o tubal ligation. She denies vomiting, numbness, weakness, fever, chills, or any other associated symptoms.  Past Medical History:  Diagnosis Date  . NWGNFAOZ(308.6Headache(784.0)     Patient Active Problem List   Diagnosis Date Noted  . Abnormal uterine bleeding (AUB) 01/26/2015  . Situational stress 01/26/2015  . Endometritis 09/15/2013    Past Surgical History:  Procedure Laterality Date  . CESAREAN SECTION  2007   [facility]  . INSERTION OF MESH N/A 11/23/2013   Procedure: INSERTION OF MESH;  Surgeon: Dalia HeadingMark A Jenkins, MD;  Location: [facility];  Service: General;  Laterality: N/A;  . VENTRAL HERNIA REPAIR N/A 11/23/2013   Procedure: VENTRAL HERNIORRAPHY;  Surgeon: Dalia HeadingMark A Jenkins, MD;  Location: [facility];  Service: General;  Laterality: N/A;    OB History    No data available       Home Medications    Prior to Admission medications   Medication Sig Start Date End Date Taking? Authorizing Provider  butalbital-acetaminophen-caffeine (FIORICET, ESGIC) 50-325-40 MG per  tablet Take 1 tablet by mouth daily as needed for migraine.  08/17/13   Historical Provider, MD  ketorolac (TORADOL) 10 MG tablet Take 1 tablet (10 mg total) by mouth every 6 (six) hours as needed. 09/26/16   Jacalyn LefevreJulie Ajani Rineer, MD  megestrol (MEGACE) 40 MG tablet Take 1 tablet (40 mg total) by mouth 3 (three) times daily. 01/26/15   Tilda BurrowJohn V Ferguson, MD    Family History Family History  Problem Relation Age of Onset  . Hypertension Mother     Social History Social History  Substance Use Topics  . Smoking status: Current Some Day Smoker    Packs/day: 0.25    Years: 1.00    Types: Cigarettes  . Smokeless tobacco: Never Used  . Alcohol use No     Comment: socially     Allergies   Review of patient's allergies indicates no known allergies.   Review of Systems Review of Systems  Constitutional: Negative for chills and fever.  Gastrointestinal: Positive for abdominal pain (LLQ) and nausea. Negative for vomiting.  Genitourinary: Positive for dysuria and vaginal bleeding.  Neurological: Negative for weakness and numbness.  All other systems reviewed and are negative.   Physical Exam Updated Vital Signs BP 127/92 (BP Location: Right Arm)   Pulse 79   Temp 98.3 F (36.8 C)   Resp 16   Ht 5' (1.524 m)   Wt 145 lb (65.8 kg)   LMP 09/12/2016   SpO2 98%   BMI 28.32 kg/m   Physical Exam  Constitutional: She is oriented to person, place, and time. She appears well-developed and well-nourished. No distress.  HENT:  Head: Normocephalic and atraumatic.  Eyes: Conjunctivae and EOM are normal.  Neck: Neck supple. No tracheal deviation present.  Cardiovascular: Normal rate.   Pulmonary/Chest: Effort normal. No respiratory distress.  Abdominal: Soft. Bowel sounds are normal. She exhibits no distension. There is tenderness (LLQ).  Musculoskeletal: Normal range of motion.  Neurological: She is alert and oriented to person, place, and time.  Skin: Skin is warm and dry.  Psychiatric: She  has a normal mood and affect. Her behavior is normal.  Nursing note and vitals reviewed.   ED Treatments / Results  Labs (all labs ordered are listed, but only abnormal results are displayed) Labs Reviewed  WET PREP, GENITAL - Abnormal; Notable for the following:       Result Value   Trich, Wet Prep PRESENT (*)    WBC, Wet Prep HPF POC FEW (*)    All other components within normal limits  URINALYSIS, ROUTINE W REFLEX MICROSCOPIC (NOT AT [facility]) - Abnormal; Notable for the following:    Hgb urine dipstick LARGE (*)    All other components within normal limits  URINE MICROSCOPIC-ADD ON - Abnormal; Notable for the following:    Squamous Epithelial / LPF TOO NUMEROUS TO COUNT (*)    Bacteria, UA FEW (*)    All other components within normal limits  PREGNANCY, URINE  GC/CHLAMYDIA PROBE AMP ([place]) NOT AT [facility]    EKG  EKG Interpretation None       Radiology No results found.  Procedures Procedures (including critical care time)  DIAGNOSTIC STUDIES: Oxygen Saturation is 99% on RA, normal by my interpretation.    COORDINATION OF CARE: 9:40 PM Discussed treatment plan with pt at bedside and pt agreed to plan.  Medications Ordered in ED Medications  metroNIDAZOLE (FLAGYL) tablet 2,000 mg (not administered)  ketorolac (TORADOL) 30 MG/ML injection 30 mg (30 mg Intramuscular Given 09/26/16 2158)     Initial Impression / Assessment and Plan / ED Course  I have reviewed the triage vital signs and the nursing notes.  Pertinent labs & imaging results that were available during my care of the patient were reviewed by me and considered in my medical decision making (see chart for details).  Clinical Course  Pt is feeling better after the toradol.  She will be treated for the trich with 2g of flagyl.  She is instructed that sexual partners need to be treated as well.  She knows to return if worse.   I personally performed the services described in this documentation, which  was scribed in my presence. The recorded information has been reviewed and is accurate.   Final Clinical Impressions(s) / ED Diagnoses   Final diagnoses:  Trichomonas vaginalis infection  DUB (dysfunctional uterine bleeding)  Abdominal pain, unspecified abdominal location    New Prescriptions New Prescriptions   KETOROLAC (TORADOL) 10 MG TABLET    Take 1 tablet (10 mg total) by mouth every 6 (six) hours as needed.     Jacalyn LefevreJulie Dashan Chizmar, MD 09/26/16 [phone]

## 2016-09-26 NOTE — ED Triage Notes (Signed)
Reports of vaginal bleeding x3 days and started today with lower abdominal pain. Denies fevers.

## 2016-09-28 LAB — GC/CHLAMYDIA PROBE AMP (~~LOC~~) NOT AT ARMC
Chlamydia: POSITIVE — AB
Neisseria Gonorrhea: NEGATIVE

## 2016-10-01 ENCOUNTER — Telehealth (HOSPITAL_BASED_OUTPATIENT_CLINIC_OR_DEPARTMENT_OTHER): Payer: Self-pay | Admitting: Emergency Medicine

## 2016-10-01 NOTE — Telephone Encounter (Signed)
Chart handoff to EDP for treatment plan 

## 2016-10-02 NOTE — ED Notes (Signed)
Attempted to call patient [phone] no answer. Letter sent.

## 2017-02-26 ENCOUNTER — Telehealth: Payer: Self-pay | Admitting: *Deleted

## 2017-02-26 NOTE — Telephone Encounter (Signed)
Patient called stating she is two weeks late for her period which is not normally late. She has taken a pregnancy test but was negative a week ago. I informed patient that it is not impossible that she could be pregnant but could have false negative tests. Advised patient to take another test first thing in the am. If it was still negative to give [place] a call back and we could possibly order Hcg level or if it was positive, to schedule appointment. Pt verbalized understanding.

## 2017-08-09 ENCOUNTER — Encounter (HOSPITAL_COMMUNITY): Payer: Self-pay | Admitting: Emergency Medicine

## 2017-08-09 ENCOUNTER — Emergency Department (HOSPITAL_COMMUNITY)
Admission: EM | Admit: 2017-08-09 | Discharge: 2017-08-09 | Disposition: A | Discharge status: Home / Self Care | Payer: BC Managed Care – PPO | Attending: Emergency Medicine | Admitting: Emergency Medicine

## 2017-08-09 DIAGNOSIS — Z79899 Other long term (current) drug therapy: Secondary | ICD-10-CM | POA: Diagnosis not present

## 2017-08-09 DIAGNOSIS — F17210 Nicotine dependence, cigarettes, uncomplicated: Secondary | ICD-10-CM | POA: Diagnosis not present

## 2017-08-09 DIAGNOSIS — J329 Chronic sinusitis, unspecified: Secondary | ICD-10-CM | POA: Diagnosis not present

## 2017-08-09 DIAGNOSIS — K047 Periapical abscess without sinus: Secondary | ICD-10-CM | POA: Diagnosis not present

## 2017-08-09 DIAGNOSIS — R0981 Nasal congestion: Secondary | ICD-10-CM | POA: Diagnosis present

## 2017-08-09 MED ORDER — IBUPROFEN 600 MG PO TABS: 600.0000 mg | ORAL_TABLET | Freq: Four times a day (QID) | ORAL | 0 refills | Status: AC

## 2017-08-09 MED ORDER — CLINDAMYCIN HCL 150 MG PO CAPS: 150.0000 mg | ORAL_CAPSULE | Freq: Four times a day (QID) | ORAL | 0 refills | Status: AC

## 2017-08-09 MED ORDER — IBUPROFEN 400 MG PO TABS
400.0000 mg | ORAL_TABLET | Freq: Once | ORAL | Status: AC
  Administered 2017-08-09: 400 mg via ORAL
  Filled 2017-08-09: qty 1

## 2017-08-09 MED ORDER — ONDANSETRON HCL 4 MG PO TABS
4.0000 mg | ORAL_TABLET | Freq: Once | ORAL | Status: AC
  Administered 2017-08-09: 4 mg via ORAL
  Filled 2017-08-09: qty 1

## 2017-08-09 MED ORDER — CLINDAMYCIN HCL 150 MG PO CAPS
300.0000 mg | ORAL_CAPSULE | Freq: Once | ORAL | Status: AC
  Administered 2017-08-09: 300 mg via ORAL
  Filled 2017-08-09: qty 2

## 2017-08-09 MED ORDER — TRAMADOL HCL 50 MG PO TABS: ORAL_TABLET | ORAL | 0 refills | Status: AC

## 2017-08-09 MED ORDER — LORATADINE-PSEUDOEPHEDRINE ER 5-120 MG PO TB12: 1.0000 | ORAL_TABLET | Freq: Two times a day (BID) | ORAL | 0 refills | Status: AC

## 2017-08-09 MED ORDER — ACETAMINOPHEN 325 MG PO TABS
650.0000 mg | ORAL_TABLET | Freq: Once | ORAL | Status: AC
  Administered 2017-08-09: 650 mg via ORAL
  Filled 2017-08-09: qty 2

## 2017-08-09 NOTE — Discharge Instructions (Signed)
Please use Claritin-D every 12 hours before your sinus congestion. Please use ibuprofen with breakfast, lunch, dinner, and at bedtime for inflammation and for pain. Use clindamycin with breakfast, lunch, dinner, and at bedtime until all taken. It may be helpful to have a container of yogurt daily while you're on the antibiotic to prevent yeast infections. May use Ultram for more severe pain. Altered may cause drowsiness, please use this medication with caution. It is important that she see a dentist as soon as possible because of the swelling and infection on your right lower jaw area.

## 2017-08-09 NOTE — ED Notes (Signed)
Pt states understanding of care given and follow up instructions.  Pt a/o ambulated from ed with steady gait 

## 2017-08-09 NOTE — ED Provider Notes (Signed)
[facility] Provider Note   CSN: [id number] Arrival date & time: 08/09/17  1602     History   Chief Complaint Chief Complaint  Patient presents with  . Nasal Congestion    HPI Shelby Torres is a 31 y.o. female.  Patient is a 31 year old female who presents to the emergency department with facial pain, nasal congestion, and toothache.  The patient states that this problem started about 3 days ago for the tooth problem, and 5 days ago for the sinus issue. The patient is been trying Sudafed, but states that that has not been helping very much. She states the pain is now nearly unbearable. She's not had fever or chills, but has not been eating very much because of the pain. She has been getting liquids in. No recent operations or procedures involving the oral area. Patient has no problems with swallowing, and no problems with speaking.      Past Medical History:  Diagnosis Date  . [id number](657.8)     Patient Active Problem List   Diagnosis Date Noted  . Abnormal uterine bleeding (AUB) 01/26/2015  . Situational stress 01/26/2015  . Endometritis 09/15/2013    Past Surgical History:  Procedure Laterality Date  . CESAREAN SECTION  2007   [facility]  . INSERTION OF MESH N/A 11/23/2013   Procedure: INSERTION OF MESH;  Surgeon: Dalia Heading, MD;  Location: [facility];  Service: General;  Laterality: N/A;  . VENTRAL HERNIA REPAIR N/A 11/23/2013   Procedure: VENTRAL HERNIORRAPHY;  Surgeon: Dalia Heading, MD;  Location: [facility];  Service: General;  Laterality: N/A;    OB History    No data available       Home Medications    Prior to Admission medications   Medication Sig Start Date End Date Taking? Authorizing Provider  butalbital-acetaminophen-caffeine (FIORICET, ESGIC) 50-325-40 MG per tablet Take 1 tablet by mouth daily as needed for migraine.  08/17/13   [provider]  clindamycin (CLEOCIN) 150 MG capsule Take 1 capsule (150 mg total) by mouth every 6  (six) hours. 08/09/17   Ivery Quale, PA-C  ibuprofen (ADVIL,MOTRIN) 600 MG tablet Take 1 tablet (600 mg total) by mouth 4 (four) times daily. 08/09/17   Ivery Quale, PA-C  ketorolac (TORADOL) 10 MG tablet Take 1 tablet (10 mg total) by mouth every 6 (six) hours as needed. 09/26/16   Jacalyn Lefevre, MD  loratadine-pseudoephedrine (CLARITIN-D 12 HOUR) 5-120 MG tablet Take 1 tablet by mouth 2 (two) times daily. 08/09/17   Ivery Quale, PA-C  megestrol (MEGACE) 40 MG tablet Take 1 tablet (40 mg total) by mouth 3 (three) times daily. 01/26/15   Tilda Burrow, MD  traMADol Janean Sark) 50 MG tablet 1 or 2 po q6h prn pain 08/09/17   Ivery Quale, PA-C    Family History Family History  Problem Relation Age of Onset  . Hypertension Mother     Social History Social History  Substance Use Topics  . Smoking status: Current Some Day Smoker    Packs/day: 0.25    Years: 1.00    Types: Cigarettes  . Smokeless tobacco: Never Used  . Alcohol use No     Comment: socially     Allergies   Patient has no known allergies.   Review of Systems Review of Systems  Constitutional: Negative for activity change.       All ROS Neg except as noted in HPI  HENT: Positive for congestion, dental problem, postnasal drip and sinus pressure.  Negative for nosebleeds.   Eyes: Negative for photophobia and discharge.  Respiratory: Negative for cough, shortness of breath and wheezing.   Cardiovascular: Negative for chest pain and palpitations.  Gastrointestinal: Negative for abdominal pain and blood in stool.  Genitourinary: Negative for dysuria, frequency and hematuria.  Musculoskeletal: Negative for arthralgias, back pain and neck pain.  Skin: Negative.   Neurological: Negative for dizziness, seizures and speech difficulty.  Psychiatric/Behavioral: Negative for confusion and hallucinations.     Physical Exam Updated Vital Signs BP 125/85 (BP Location: Right Arm)   Pulse 65   Temp 98.4 F (36.9 C)  (Oral)   Resp 16   Wt 69.9 kg (154 lb)   LMP 08/01/2017   SpO2 98%   BMI 30.08 kg/m   Physical Exam  Constitutional: She is oriented to person, place, and time. She appears well-developed and well-nourished.  Non-toxic appearance.  HENT:  Head: Normocephalic.  Right Ear: Tympanic membrane and external ear normal.  Left Ear: Tympanic membrane and external ear normal.  There is pain to percussion over the maxillary sinus areas. Patient has nasal congestion present.  There is swelling near the area of the right lower third molar as well as the second molar. The second molar has a cavity with a broken area to it. The airway is patent. There is no swelling under the tongue.  Eyes: Pupils are equal, round, and reactive to light. EOM and lids are normal.  Neck: Normal range of motion. Neck supple. Carotid bruit is not present.  Cardiovascular: Normal rate, regular rhythm, normal heart sounds, intact distal pulses and normal pulses.   Pulmonary/Chest: Breath sounds normal. No respiratory distress.  Abdominal: Soft. Bowel sounds are normal. There is no tenderness. There is no guarding.  Musculoskeletal: Normal range of motion.  Lymphadenopathy:       Head (right side): No submandibular adenopathy present.       Head (left side): No submandibular adenopathy present.    She has no cervical adenopathy.  Neurological: She is alert and oriented to person, place, and time. She has normal strength. No cranial nerve deficit or sensory deficit.  Skin: Skin is warm and dry.  Psychiatric: She has a normal mood and affect. Her speech is normal.  Nursing note and vitals reviewed.    ED Treatments / Results  Labs (all labs ordered are listed, but only abnormal results are displayed) Labs Reviewed - No data to display  EKG  EKG Interpretation None       Radiology No results found.  Procedures Procedures (including critical care time)  Medications Ordered in ED Medications  clindamycin  (CLEOCIN) capsule 300 mg (300 mg Oral Given 08/09/17 1933)  ondansetron (ZOFRAN) tablet 4 mg (4 mg Oral Given 08/09/17 1933)  ibuprofen (ADVIL,MOTRIN) tablet 400 mg (400 mg Oral Given 08/09/17 1933)  acetaminophen (TYLENOL) tablet 650 mg (650 mg Oral Given 08/09/17 1933)     Initial Impression / Assessment and Plan / ED Course  I have reviewed the triage vital signs and the nursing notes.  Pertinent labs & imaging results that were available during my care of the patient were reviewed by me and considered in my medical decision making (see chart for details).      Final Clinical Impressions(s) / ED Diagnoses MDM Vital signs within normal limits. Examination suggest sinusitis and dental infection. Patient will be treated with clindamycin, ibuprofen, Claritin-D. The patient is advised to see a dentist as soon as possible. We discussed importance of good hydration  and good handwashing. The patient will return to the emergency department if any high fever, bloody mucus discharge, difficulty with swallowing, or difficulty with speaking. Patient is in agreement with this plan.    Final diagnoses:  Dental infection  Sinusitis, unspecified chronicity, unspecified location    New Prescriptions New Prescriptions   CLINDAMYCIN (CLEOCIN) 150 MG CAPSULE    Take 1 capsule (150 mg total) by mouth every 6 (six) hours.   IBUPROFEN (ADVIL,MOTRIN) 600 MG TABLET    Take 1 tablet (600 mg total) by mouth 4 (four) times daily.   LORATADINE-PSEUDOEPHEDRINE (CLARITIN-D 12 HOUR) 5-120 MG TABLET    Take 1 tablet by mouth 2 (two) times daily.   TRAMADOL (ULTRAM) 50 MG TABLET    1 or 2 po q6h prn pain     Ivery Quale, PA-C 08/09/17 1942    Bethann Berkshire, MD 08/10/17 1226

## 2017-08-09 NOTE — ED Triage Notes (Signed)
Pt c/o nasal congestion/facial pain x 1 week and right lower toothache x 2 days.

## 2019-12-03 ENCOUNTER — Ambulatory Visit: Payer: Medicaid Other | Attending: Internal Medicine

## 2019-12-03 ENCOUNTER — Other Ambulatory Visit: Payer: Self-pay

## 2019-12-03 DIAGNOSIS — Z20822 Contact with and (suspected) exposure to covid-19: Secondary | ICD-10-CM | POA: Insufficient documentation

## 2019-12-04 LAB — NOVEL CORONAVIRUS, NAA: SARS-CoV-2, NAA: NOT DETECTED

## 2022-03-22 ENCOUNTER — Encounter (HOSPITAL_COMMUNITY): Payer: Self-pay | Admitting: Emergency Medicine

## 2022-08-07 ENCOUNTER — Encounter: Payer: BC Managed Care – PPO | Admitting: Obstetrics & Gynecology

## 2022-08-15 ENCOUNTER — Ambulatory Visit (INDEPENDENT_AMBULATORY_CARE_PROVIDER_SITE_OTHER): Payer: BC Managed Care – PPO | Admitting: Obstetrics & Gynecology

## 2022-08-15 ENCOUNTER — Encounter: Payer: Self-pay | Admitting: Obstetrics & Gynecology

## 2022-08-15 VITALS — BP 125/92 | HR 82 | Ht 60.0 in | Wt 169.0 lb

## 2022-08-15 DIAGNOSIS — L304 Erythema intertrigo: Secondary | ICD-10-CM | POA: Diagnosis not present

## 2022-08-15 DIAGNOSIS — R102 Pelvic and perineal pain: Secondary | ICD-10-CM | POA: Diagnosis not present

## 2022-08-15 DIAGNOSIS — N920 Excessive and frequent menstruation with regular cycle: Secondary | ICD-10-CM

## 2022-08-15 DIAGNOSIS — D500 Iron deficiency anemia secondary to blood loss (chronic): Secondary | ICD-10-CM

## 2022-08-15 MED ORDER — CLOTRIMAZOLE-BETAMETHASONE 1-0.05 % EX CREA: 1.0000 | TOPICAL_CREAM | Freq: Two times a day (BID) | CUTANEOUS | 0 refills | Status: AC

## 2022-08-15 MED ORDER — TRANEXAMIC ACID 650 MG PO TABS: 1300.0000 mg | ORAL_TABLET | Freq: Three times a day (TID) | ORAL | 6 refills | Status: AC

## 2022-08-15 NOTE — Progress Notes (Signed)
GYN VISIT Patient name: Shelby Torres MRN [id number]  Date of birth: 1986-01-25 Chief Complaint:   abnormal periods (Ovarian pain)  History of Present Illness:   Shelby Torres is a 36 y.o. G2P2  female being seen today for the following concerns:  -Pain: Pain on her left side that started about a year ago.  Intermittent, at least 3-4 days per week.  More noticeable week leading up to her period.  Taking ibuprofen with no improvement.  Sometimes pain is a stabbing sharp pain, versus constant pain.  On occasion nausea, no vomiting.  Pain seems to be coming more frequent.  -HMB:  Menses are every month and in the past 6-32mos have gotten heavier.  At times, having an accident due to soaking through pad/tampon in under an hour.  First 4-5 heavy days then typically last about 6-7 days.  Denies postcoital or intermenstrual bleeding. Bleeding has led to anemia- being treated by PCP with oral iron.  -Rash: Under her right breast she notes an itching rash.  She has not tried any OTC medication and has not had this happen in the past.  It has been there a few days, maybe up to a week.  C-sction x2  Patient's last menstrual period was 07/15/2022.     08/15/2022    3:47 PM  Depression screen PHQ 2/9  Decreased Interest 1  Down, Depressed, Hopeless 1  PHQ - 2 Score 2  Altered sleeping 1  Tired, decreased energy 1  Change in appetite 0  Feeling bad or failure about yourself  0  Trouble concentrating 1  Moving slowly or fidgety/restless 0  Suicidal thoughts 0  PHQ-9 Score 5     Review of Systems:   Pertinent items are noted in HPI Denies fever/chills, dizziness, headaches, visual disturbances, fatigue, shortness of breath, chest pain, abdominal pain, vomiting, no issues with  bowel movements, urination, or intercourse unless otherwise stated above.  Pertinent History Reviewed:  Reviewed past medical,surgical, social, obstetrical and family history.  Reviewed problem list, medications  and allergies. Physical Assessment:   Vitals:   08/15/22 1533  BP: (!) 125/92  Pulse: 82  Weight: 169 lb (76.7 kg)  Height: 5' (1.524 m)  Body mass index is 33.01 kg/m.       Physical Examination:   General appearance: alert, well appearing, and in no distress  Psych: mood appropriate, normal affect  Skin: warm & dry   Cardiovascular: normal heart rate noted  Respiratory: normal respiratory effort, no distress  Breast: under left breast well-demarcated pink patch  Abdomen: soft, non-tender, no rebound, no guarding, no reproducible right-sided pain  Pelvic: VULVA: normal appearing vulva with no masses, tenderness or lesions, VAGINA: normal appearing vagina with normal color and discharge, no lesions, CERVIX: normal appearing cervix without discharge or lesions, UTERUS: uterus is normal size, shape, consistency and nontender, ADNEXA: normal adnexa in size, nontender and no masses  Extremities: no edema   Chaperone:  pt declined     Assessment & Plan:  1) Pelvic pain -plan for ultrasound to r/o underlying etiology, further management pending results  2) HMB- discussed concerns for AUB-O -due to tobacco use, recommended progesterone or non-hormonal options -discussed medications like POPs to IUD to surgical intervention -risk/benefit of each options reviewed, questions addressed -pt prefers to avoid hormones -Trial of TXA, f/u in 28mos  3) Iron def. Aneima -continue with OTC iron therapy and management per PCP  4) Intertrigo -encouraged pt to keep area clean/dry -Rx sent  in  Meds ordered this encounter  Medications   tranexamic acid (LYSTEDA) 650 MG TABS tablet    Sig: Take 2 tablets (1,300 mg total) by mouth 3 (three) times daily. During menses only for a max of 5 days    Dispense:  30 tablet    Refill:  6   clotrimazole-betamethasone (LOTRISONE) cream    Sig: Apply 1 Application topically 2 (two) times daily for 7 days.    Dispense:  30 g    Refill:  0    Orders  Placed This Encounter  Procedures   US PELVIS TRANSVAGINAL NON-OB (TV ONLY)    Return in about 3 months (around 11/14/2022) for Medication follow up, please schedule next available pelvic US [facility] or [facility] .   Myna Hidalgo, DO Attending Obstetrician & Gynecologist, [facility] for [employer], [facility]

## 2022-08-18 ENCOUNTER — Other Ambulatory Visit: Payer: Self-pay | Admitting: Obstetrics & Gynecology

## 2022-08-18 ENCOUNTER — Ambulatory Visit (HOSPITAL_BASED_OUTPATIENT_CLINIC_OR_DEPARTMENT_OTHER): Payer: BC Managed Care – PPO

## 2022-08-18 ENCOUNTER — Ambulatory Visit (HOSPITAL_BASED_OUTPATIENT_CLINIC_OR_DEPARTMENT_OTHER)
Admission: RE | Admit: 2022-08-18 | Discharge: 2022-08-18 | Disposition: A | Discharge status: Home / Self Care | Payer: BC Managed Care – PPO | Source: Ambulatory Visit | Attending: Obstetrics & Gynecology | Admitting: Obstetrics & Gynecology

## 2022-08-18 DIAGNOSIS — R102 Pelvic and perineal pain: Secondary | ICD-10-CM

## 2022-08-18 DIAGNOSIS — N920 Excessive and frequent menstruation with regular cycle: Secondary | ICD-10-CM
